# Patient Record
Sex: Female | Born: 1965 | Race: White | Hispanic: No | Marital: Married | Smoking: Never smoker
Health system: Southern US, Community
[De-identification: ages and names within clinical notes are randomized; demographics above are authoritative.]

## PROBLEM LIST (undated history)

## (undated) DIAGNOSIS — E785 Hyperlipidemia, unspecified: Secondary | ICD-10-CM

## (undated) DIAGNOSIS — F419 Anxiety disorder, unspecified: Secondary | ICD-10-CM

## (undated) DIAGNOSIS — Z789 Other specified health status: Secondary | ICD-10-CM

## (undated) DIAGNOSIS — J45909 Unspecified asthma, uncomplicated: Secondary | ICD-10-CM

## (undated) DIAGNOSIS — F32A Depression, unspecified: Secondary | ICD-10-CM

## (undated) HISTORY — DX: Hyperlipidemia, unspecified: E78.5

## (undated) HISTORY — DX: Depression, unspecified: F32.A

## (undated) HISTORY — PX: ABDOMINAL HYSTERECTOMY: SHX81

## (undated) HISTORY — PX: BACK SURGERY: SHX140

## (undated) HISTORY — PX: APPENDECTOMY: SHX54

## (undated) HISTORY — PX: OVARIAN CYST REMOVAL: SHX89

## (undated) HISTORY — DX: Anxiety disorder, unspecified: F41.9

---
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
## 2002-10-30 ENCOUNTER — Other Ambulatory Visit: Admission: RE | Admit: 2002-10-30 | Discharge: 2002-10-30 | Payer: Self-pay | Admitting: Obstetrics and Gynecology

## 2003-11-05 ENCOUNTER — Other Ambulatory Visit: Admission: RE | Admit: 2003-11-05 | Discharge: 2003-11-05 | Payer: Self-pay | Admitting: Obstetrics and Gynecology

## 2007-10-16 ENCOUNTER — Encounter: Admission: RE | Admit: 2007-10-16 | Discharge: 2007-10-16 | Payer: Self-pay | Admitting: Obstetrics and Gynecology

## 2010-10-25 ENCOUNTER — Observation Stay (HOSPITAL_COMMUNITY)
Admission: EM | Admit: 2010-10-25 | Discharge: 2010-10-27 | Disposition: A | Discharge status: Home / Self Care | Payer: BC Managed Care – HMO | Attending: General Surgery | Admitting: General Surgery

## 2010-10-25 ENCOUNTER — Emergency Department (HOSPITAL_COMMUNITY): Payer: BC Managed Care – HMO

## 2010-10-25 DIAGNOSIS — Z01812 Encounter for preprocedural laboratory examination: Secondary | ICD-10-CM | POA: Insufficient documentation

## 2010-10-25 DIAGNOSIS — K3580 Unspecified acute appendicitis: Principal | ICD-10-CM | POA: Insufficient documentation

## 2010-10-25 DIAGNOSIS — F411 Generalized anxiety disorder: Secondary | ICD-10-CM | POA: Insufficient documentation

## 2010-10-25 LAB — URINALYSIS, ROUTINE W REFLEX MICROSCOPIC
Ketones, ur: NEGATIVE mg/dL
Leukocytes, UA: NEGATIVE
Nitrite: NEGATIVE
Specific Gravity, Urine: 1.023 (ref 1.005–1.030)
pH: 5.5 (ref 5.0–8.0)

## 2010-10-25 LAB — CBC
HCT: 36.7 % (ref 36.0–46.0)
Hemoglobin: 12.6 g/dL (ref 12.0–15.0)
MCH: 29.7 pg (ref 26.0–34.0)
MCHC: 34.3 g/dL (ref 30.0–36.0)
RDW: 13.4 % (ref 11.5–15.5)

## 2010-10-25 LAB — COMPREHENSIVE METABOLIC PANEL
CO2: 26 mEq/L (ref 19–32)
Calcium: 8.7 mg/dL (ref 8.4–10.5)
Creatinine, Ser: 0.56 mg/dL (ref 0.4–1.2)
GFR calc non Af Amer: >60 mL/min (ref >60)
Glucose, Bld: 118 mg/dL — ABNORMAL HIGH (ref 70–99)
Total Protein: 7 g/dL (ref 6.0–8.3)

## 2010-10-25 LAB — POCT I-STAT, CHEM 8
BUN: 12 mg/dL (ref 6–23)
Calcium, Ion: 1.02 mmol/L — ABNORMAL LOW (ref 1.12–1.32)
Creatinine, Ser: 0.6 mg/dL (ref 0.4–1.2)
Hemoglobin: 12.6 g/dL (ref 12.0–15.0)
TCO2: 22 mmol/L (ref 0–100)

## 2010-10-25 LAB — URINE MICROSCOPIC-ADD ON

## 2010-10-25 LAB — DIFFERENTIAL
Basophils Absolute: 0 10*3/uL (ref 0.0–0.1)
Basophils Relative: 0 % (ref 0–1)
Eosinophils Relative: 2 % (ref 0–5)
Monocytes Absolute: 1.1 10*3/uL — ABNORMAL HIGH (ref 0.1–1.0)
Monocytes Relative: 9 % (ref 3–12)
Neutro Abs: 9.6 10*3/uL — ABNORMAL HIGH (ref 1.7–7.7)

## 2010-10-25 LAB — POCT PREGNANCY, URINE: Preg Test, Ur: NEGATIVE

## 2010-10-25 MED ORDER — IOHEXOL 300 MG/ML  SOLN
100.0000 mL | Freq: Once | INTRAMUSCULAR | Status: AC | PRN
  Administered 2010-10-25: 100 mL via INTRAVENOUS

## 2010-10-26 ENCOUNTER — Other Ambulatory Visit: Payer: Self-pay | Admitting: General Surgery

## 2010-10-26 LAB — DIFFERENTIAL
Basophils Relative: 0 % (ref 0–1)
Eosinophils Relative: 0 % (ref 0–5)
Monocytes Absolute: 0.7 10*3/uL (ref 0.1–1.0)
Monocytes Relative: 6 % (ref 3–12)
Neutro Abs: 10.3 10*3/uL — ABNORMAL HIGH (ref 1.7–7.7)

## 2010-10-26 LAB — CA 125: CA 125: 48.8 U/mL — ABNORMAL HIGH (ref 0.0–30.2)

## 2010-10-26 LAB — CBC
Hemoglobin: 10.6 g/dL — ABNORMAL LOW (ref 12.0–15.0)
MCH: 29.2 pg (ref 26.0–34.0)
MCHC: 34 g/dL (ref 30.0–36.0)

## 2010-11-02 NOTE — Discharge Summary (Signed)
  NAME:  Renee Chambers, Renee Chambers            ACCOUNT NO.:  [id number]  MEDICAL RECORD NO.:  [id number]           PATIENT TYPE:  I  LOCATION:  5122                         FACILITY:  [facility]  PHYSICIAN:  Ollen Gross. Vernell Morgans, M.D. DATE OF BIRTH:  [birth year]  DATE OF ADMISSION:  10/25/2010 DATE OF DISCHARGE:  10/27/2010                              DISCHARGE SUMMARY   HISTORY OF PRESENT ILLNESS:  Renee Chambers is a patient who complains of abdominal pain in the right lower quadrant, which was associated with nausea, but no vomiting, no fever or chills.  She has a history of previous ovarian cyst and has had  a couple of surgeries for those as well.  She otherwise had some generalized anxiety disorder, which she takes Zoloft for.  The patient was seen after the emergency room workup showed evidence of possible early appendicitis that correlated with an elevated white blood cell count.  Dr. Carolynne Edouard examined the patient and agreed that surgical appendectomy would be recommended.  SUMMARY OF HOSPITAL COURSE:  The patient was admitted on October 25, 2010, was taken to the operating room same day.  She underwent laparoscopic appendectomy.  However, incidentally there was a lot of bloody fluid in the lower pelvis with a lot of scar tissue in the pelvis consistent with prior ovarian surgery.  They apparently were not able to visualize the ovaries, but the findings were consistent with a possible ruptured ovarian cyst.  The patient tolerated the procedure well, was admitted to the floor in stable condition.  Postoperatively, Dr. Carolynne Edouard ordered some tumor markers as it was not clearly evident that appendicitis was the only problem going on.  Her CA19-9 was normal, but her CA-125 was elevated at 48.8. She has otherwise done well, complaining of only mild soreness.  Has tolerated a diet and feels ready for discharge home.  At this time of this discharge, her pathology report is not back yet.  DISCHARGE  DIAGNOSES: 1. Appendicitis, status post laparoscopic appendectomy. 2. History of ovarian cyst, possible acute ruptured cyst this     admission. 3. Generalized anxiety disorder.  DISCHARGE MEDICATIONS:  Include 1. Zyrtec 10 mg daily. 2. Zoloft 100 mg daily. 3. Vicodin 1-2 tablets q.6 h. p.r.n. pain.  I have asked her to contact Dr. Billey Chang office at [facility] to have an appointment made for her in approximately 2 weeks' time.  She is to increase her activity slowly, otherwise, avoid heavy lifting or strenuous activity.  She is to contact our office if any increased pain, nausea, vomiting, or high fevers.     Brayton El, PA-C   ______________________________ Ollen Gross. Vernell Morgans, M.D.    KB/MEDQ  D:  10/27/2010  T:  10/27/2010  Job:  [id number]  Electronically Signed by Brayton El  on 11/01/2010 02:10:20 PM Electronically Signed by Chevis Pretty III M.D. on 11/02/2010 09:30:47 AM

## 2010-11-02 NOTE — Op Note (Signed)
NAME:  Renee Chambers, Renee Chambers            ACCOUNT NO.:  [id number]  MEDICAL RECORD NO.:  [id number]           PATIENT TYPE:  I  LOCATION:  5122                         FACILITY:  [facility]  PHYSICIAN:  Ollen Gross. Vernell Morgans, M.D. DATE OF BIRTH:  July 17, 1965  DATE OF PROCEDURE:  10/26/2010 DATE OF DISCHARGE:                              OPERATIVE REPORT   PREOPERATIVE DIAGNOSIS:  Appendicitis.  POSTOPERATIVE DIAGNOSES:  Appendicitis and possible ruptured ovarian cyst.  PROCEDURES:  Laparoscopic appendectomy.  SURGEON:  Ollen Gross. Vernell Morgans, MD  ANESTHESIA:  General endotracheal.  PROCEDURE IN DETAIL:  After informed consent was obtained, the patient was brought to the operating room, placed in supine position on the operating room table.  After adequate induction of general anesthesia, the patient's abdomen was prepped with ChloraPrep, allowed to dry and draped in usual sterile manner.  The area below the umbilicus was infiltrated with 0.25% Marcaine.  Incision was made with a 15 blade knife.  This incision was carried down through the subcutaneous tissue bluntly with a hemostat and [employer] retractors until linea alba was identified.  Linea alba was incised with a 15-blade knife and each side was grasped with Kocher clamps and elevated anteriorly.  The preperitoneal space was then probed bluntly with the hemostat, the peritoneum was opened, and access was gained to the abdominal cavity.  A 0-Vicryl purse-string stitch was placed in the fascia around the opening.  A Hasson cannula was placed through the opening and anchored in place with the previously placed Vicryl purse-string stitch.  The abdomen was then insufflated with carbon dioxide without difficulty. The patient was placed in Trendelenburg position, rotated with the right side up.  Next, suprapubic area was infiltrated with 0.25% Marcaine.  A small incision was made with a 15 blade knife and 10-mm port was placed bluntly through this  incision into the abdominal cavity under direct vision.  A site between the two was chosen for a 5-mm port.  This area was infiltrated with 0.25% Marcaine.  A small stab incision was made with a 15 blade knife and 5-mm port was placed bluntly through the incision into the abdominal cavity under direct vision.  On inspection of the abdomen, there was a small-to-moderate amount of dark blood present, seemed to be centered down in the pelvis.  The appendix was identified.  It did appear to be little bit inflamed and think near the tip.  At this point, we tried to see the ovaries, but due to her previous two surgeries on her ovary, her tubes and ovaries were fairly stuck down the pelvis.  We were not able to get a very good visualization, but she did seem to have some dark blood consistent with the ruptured ovarian cyst.  Everything else seemed to be sort of secondarily inflamed little bit, there was no pus.  We tried to see the tubes and ovaries, but we could only see part of them, but we could not visualize the entire structures.  At this point, we then took down the mesoappendix sharply with the harmonic scalpel.  Once we were at the base of the appendix where it  joined with the cecum, and this area was cleared of any tissue debris.  We were able to place a laparoscopic blue load GIA 45 mm of 6-row stapler across the base of the appendix where it joined the cecum clamped and fired thereby dividing the base of the appendix between staple lines.  A laparoscopic bag was inserted through the Xylocaine.  The appendix placed in the bag and bag was sealed.  The abdomen was then irrigated with copious amounts of saline, and removed as much as the blood as possible.  The appendix and bag were removed with the Hasson cannula through the infraumbilical port without difficulty.  The fascial defect was closed with the previously placed Vicryl pursestring stitch as well as another figure-of-eight  0-Vicryl stitch.  The rest of the ports were then removed under direct vision and were found to be hemostatic.  The gas was allowed to escape.  The skin incisions were all closed with interrupted 4-0 Monocryl subcuticular stitches.  Dermabond dressings were applied.  The patient tolerated the procedure well.  At the end of the case, all needle, sponge, and instrument counts were correct.  The patient was then awakened and taken to the recovery room in a stable condition.     Ollen Gross. Vernell Morgans, M.D.     PST/MEDQ  D:  10/26/2010  T:  10/26/2010  Job:  [id number]  Electronically Signed by Chevis Pretty III M.D. on 11/02/2010 09:30:43 AM

## 2010-11-02 NOTE — H&P (Signed)
  NAME:  Renee Chambers, Renee Chambers            ACCOUNT NO.:  [id number]  MEDICAL RECORD NO.:  [id number]           PATIENT TYPE:  I  LOCATION:  5122                         FACILITY:  [facility]  PHYSICIAN:  Ollen Gross. Vernell Morgans, M.D. DATE OF BIRTH:  Jan 26, 1966  DATE OF ADMISSION:  10/25/2010 DATE OF DISCHARGE:                             HISTORY & PHYSICAL   Renee Chambers is a 45 year old white female, who presents to the emergency department with right lower quadrant pain that started yesterday evening.  The pain has been associated with nausea, but no vomiting.  She denies any fevers or chills.  No chest pain or shortness of breath.  No diarrhea or dysuria.  The rest of her review of systems are unremarkable.  PAST MEDICAL HISTORY:  Significant for: 1. Ovarian cyst. 2. Anxiety.  PAST SURGICAL HISTORY:  Significant for two surgeries for an ovarian cyst.  MEDICATIONS:  Include Zoloft and Zyrtec.  Allergies are to MORPHINE, which causes itching.  SOCIAL HISTORY:  She denies use of alcohol or tobacco products.  FAMILY HISTORY:  Noncontributory.  PHYSICAL EXAMINATION:  VITAL SIGNS:  Temperature is 98.9, pulse 104, blood pressure 116/67. GENERAL:  She is a well-developed, well-nourished white female in no acute distress. SKIN:  Warm and dry.  No jaundice. EYES:  Extraocular muscles are intact.  Pupils are equal, round, and reactive to light.  Sclerae nonicteric. LUNGS:  Clear bilaterally no use of accessory respiratory muscles. HEART:  Regular rate and rhythm with impulse in the left chest. ABDOMEN:  Soft with some focal right lower quadrant tenderness, but no guarding or peritonitis.  No palpable mass or hepatosplenomegaly. EXTREMITIES:  No cyanosis, clubbing, or edema with good strength in her arms and legs.  PSYCHOLOGIC:  She is alert and oriented x3 with no evidence of anxiety or depression.  On review of her lab work, significant for white count of 12.4.  Her CT scan showed possible  early appendicitis.  ASSESSMENT AND PLAN:  This is a 45 year old white female with what appears to be an acute appendicitis.  Because of the risk of perforation and sepsis, I think she would benefit from having her appendix removed. She would also like to have this done.  I have explained her in detail the risks and benefits of operation to remove the appendix as well as some of technical aspects including the risk of leak, and she understands and wishes to proceed.  We will obtain some routine preoperative lab in preparation of doing this for tonight.     Ollen Gross. Vernell Morgans, M.D.     PST/MEDQ  D:  10/25/2010  T:  10/26/2010  Job:  [id number]  Electronically Signed by Chevis Pretty III M.D. on 11/02/2010 09:30:39 AM

## 2011-02-21 ENCOUNTER — Emergency Department (HOSPITAL_COMMUNITY): Payer: BC Managed Care – HMO

## 2011-02-21 ENCOUNTER — Emergency Department (HOSPITAL_COMMUNITY)
Admission: EM | Admit: 2011-02-21 | Discharge: 2011-02-21 | Disposition: A | Discharge status: Home / Self Care | Payer: BC Managed Care – HMO | Attending: Emergency Medicine | Admitting: Emergency Medicine

## 2011-02-21 DIAGNOSIS — R079 Chest pain, unspecified: Secondary | ICD-10-CM | POA: Insufficient documentation

## 2011-02-21 DIAGNOSIS — R0602 Shortness of breath: Secondary | ICD-10-CM | POA: Insufficient documentation

## 2011-02-21 DIAGNOSIS — R799 Abnormal finding of blood chemistry, unspecified: Secondary | ICD-10-CM | POA: Insufficient documentation

## 2011-02-21 DIAGNOSIS — M549 Dorsalgia, unspecified: Secondary | ICD-10-CM | POA: Insufficient documentation

## 2011-02-21 LAB — URINE MICROSCOPIC-ADD ON

## 2011-02-21 LAB — DIFFERENTIAL
Basophils Relative: 1 % (ref 0–1)
Eosinophils Absolute: 0.7 10*3/uL (ref 0.0–0.7)
Neutrophils Relative %: 62 % (ref 43–77)

## 2011-02-21 LAB — CBC
Platelets: 295 10*3/uL (ref 150–400)
RBC: 4.55 MIL/uL (ref 3.87–5.11)
WBC: 9.5 10*3/uL (ref 4.0–10.5)

## 2011-02-21 LAB — URINALYSIS, ROUTINE W REFLEX MICROSCOPIC
Bilirubin Urine: NEGATIVE
Specific Gravity, Urine: 1.012 (ref 1.005–1.030)
Urobilinogen, UA: 0.2 mg/dL (ref 0.0–1.0)
pH: 5.5 (ref 5.0–8.0)

## 2011-02-21 LAB — POCT I-STAT, CHEM 8
HCT: 40 % (ref 36.0–46.0)
Hemoglobin: 13.6 g/dL (ref 12.0–15.0)
Potassium: 4.5 mEq/L (ref 3.5–5.1)
Sodium: 140 mEq/L (ref 135–145)

## 2011-02-21 LAB — POCT I-STAT TROPONIN I: Troponin i, poc: 0 ng/mL (ref 0.00–0.08)

## 2011-02-21 LAB — D-DIMER, QUANTITATIVE: D-Dimer, Quant: 0.69 ug/mL-FEU — ABNORMAL HIGH (ref 0.00–0.48)

## 2011-02-21 MED ORDER — IOHEXOL 300 MG/ML  SOLN
80.0000 mL | Freq: Once | INTRAMUSCULAR | Status: AC | PRN
  Administered 2011-02-21: 80 mL via INTRAVENOUS

## 2012-12-20 ENCOUNTER — Ambulatory Visit (INDEPENDENT_AMBULATORY_CARE_PROVIDER_SITE_OTHER): Payer: BC Managed Care – HMO | Admitting: Otolaryngology

## 2012-12-20 DIAGNOSIS — H9209 Otalgia, unspecified ear: Secondary | ICD-10-CM

## 2012-12-20 DIAGNOSIS — J342 Deviated nasal septum: Secondary | ICD-10-CM

## 2012-12-20 DIAGNOSIS — J310 Chronic rhinitis: Secondary | ICD-10-CM

## 2013-01-10 ENCOUNTER — Ambulatory Visit (INDEPENDENT_AMBULATORY_CARE_PROVIDER_SITE_OTHER): Payer: BC Managed Care – HMO | Admitting: Otolaryngology

## 2013-08-05 ENCOUNTER — Encounter (HOSPITAL_COMMUNITY): Payer: Self-pay | Admitting: Emergency Medicine

## 2013-08-05 ENCOUNTER — Inpatient Hospital Stay (HOSPITAL_COMMUNITY)
Admission: EM | Admit: 2013-08-05 | Discharge: 2013-08-08 | DRG: 690 | Disposition: A | Discharge status: Home / Self Care | Payer: BC Managed Care – PPO | Attending: Internal Medicine | Admitting: Internal Medicine

## 2013-08-05 ENCOUNTER — Emergency Department (HOSPITAL_COMMUNITY): Payer: BC Managed Care – PPO

## 2013-08-05 DIAGNOSIS — Z23 Encounter for immunization: Secondary | ICD-10-CM

## 2013-08-05 DIAGNOSIS — E876 Hypokalemia: Secondary | ICD-10-CM | POA: Diagnosis present

## 2013-08-05 DIAGNOSIS — F3289 Other specified depressive episodes: Secondary | ICD-10-CM | POA: Diagnosis present

## 2013-08-05 DIAGNOSIS — Z8249 Family history of ischemic heart disease and other diseases of the circulatory system: Secondary | ICD-10-CM

## 2013-08-05 DIAGNOSIS — F329 Major depressive disorder, single episode, unspecified: Secondary | ICD-10-CM | POA: Diagnosis present

## 2013-08-05 DIAGNOSIS — Z79899 Other long term (current) drug therapy: Secondary | ICD-10-CM

## 2013-08-05 DIAGNOSIS — R7881 Bacteremia: Secondary | ICD-10-CM | POA: Diagnosis present

## 2013-08-05 DIAGNOSIS — D649 Anemia, unspecified: Secondary | ICD-10-CM | POA: Diagnosis present

## 2013-08-05 DIAGNOSIS — A498 Other bacterial infections of unspecified site: Secondary | ICD-10-CM | POA: Diagnosis present

## 2013-08-05 DIAGNOSIS — N920 Excessive and frequent menstruation with regular cycle: Secondary | ICD-10-CM | POA: Diagnosis present

## 2013-08-05 DIAGNOSIS — N12 Tubulo-interstitial nephritis, not specified as acute or chronic: Principal | ICD-10-CM | POA: Diagnosis present

## 2013-08-05 DIAGNOSIS — F32A Depression, unspecified: Secondary | ICD-10-CM | POA: Diagnosis present

## 2013-08-05 HISTORY — DX: Other specified health status: Z78.9

## 2013-08-05 LAB — CBC WITH DIFFERENTIAL/PLATELET
Basophils Absolute: 0 10*3/uL (ref 0.0–0.1)
Basophils Relative: 0 % (ref 0–1)
EOS ABS: 0 10*3/uL (ref 0.0–0.7)
Eosinophils Relative: 0 % (ref 0–5)
HCT: 33.4 % — ABNORMAL LOW (ref 36.0–46.0)
HEMOGLOBIN: 10.9 g/dL — AB (ref 12.0–15.0)
Lymphocytes Relative: 5 % — ABNORMAL LOW (ref 12–46)
Lymphs Abs: 0.6 10*3/uL — ABNORMAL LOW (ref 0.7–4.0)
MCH: 26.8 pg (ref 26.0–34.0)
MCHC: 32.6 g/dL (ref 30.0–36.0)
MCV: 82.3 fL (ref 78.0–100.0)
Monocytes Absolute: 0.9 10*3/uL (ref 0.1–1.0)
Monocytes Relative: 7 % (ref 3–12)
NEUTROS PCT: 88 % — AB (ref 43–77)
Neutro Abs: 10.5 10*3/uL — ABNORMAL HIGH (ref 1.7–7.7)
Platelets: 244 10*3/uL (ref 150–400)
RBC: 4.06 MIL/uL (ref 3.87–5.11)
RDW: 13.9 % (ref 11.5–15.5)
WBC: 12 10*3/uL — ABNORMAL HIGH (ref 4.0–10.5)

## 2013-08-05 LAB — COMPREHENSIVE METABOLIC PANEL
ALK PHOS: 73 U/L (ref 39–117)
ALT: 11 U/L (ref 0–35)
AST: 13 U/L (ref 0–37)
Albumin: 3.6 g/dL (ref 3.5–5.2)
BILIRUBIN TOTAL: 0.5 mg/dL (ref 0.3–1.2)
BUN: 10 mg/dL (ref 6–23)
CO2: 24 meq/L (ref 19–32)
Calcium: 8.9 mg/dL (ref 8.4–10.5)
Chloride: 100 mEq/L (ref 96–112)
Creatinine, Ser: 0.53 mg/dL (ref 0.50–1.10)
GLUCOSE: 121 mg/dL — AB (ref 70–99)
POTASSIUM: 3.4 meq/L — AB (ref 3.7–5.3)
Sodium: 137 mEq/L (ref 137–147)
TOTAL PROTEIN: 7.2 g/dL (ref 6.0–8.3)

## 2013-08-05 LAB — URINE MICROSCOPIC-ADD ON

## 2013-08-05 LAB — POCT PREGNANCY, URINE: Preg Test, Ur: NEGATIVE

## 2013-08-05 LAB — URINALYSIS, ROUTINE W REFLEX MICROSCOPIC
BILIRUBIN URINE: NEGATIVE
GLUCOSE, UA: NEGATIVE mg/dL
KETONES UR: NEGATIVE mg/dL
Nitrite: NEGATIVE
PH: 7.5 (ref 5.0–8.0)
Protein, ur: NEGATIVE mg/dL
Specific Gravity, Urine: 1.015 (ref 1.005–1.030)
Urobilinogen, UA: 0.2 mg/dL (ref 0.0–1.0)

## 2013-08-05 MED ORDER — CEFTRIAXONE SODIUM 2 G IJ SOLR
2.0000 g | Freq: Once | INTRAMUSCULAR | Status: DC
  Filled 2013-08-05 (×2): qty 2

## 2013-08-05 MED ORDER — IOHEXOL 300 MG/ML  SOLN: 100.0000 mL | Freq: Once | INTRAMUSCULAR | Status: AC | PRN

## 2013-08-05 MED ORDER — SODIUM CHLORIDE 0.9 % IV SOLN
1000.0000 mL | INTRAVENOUS | Status: DC
  Administered 2013-08-05: 1000 mL via INTRAVENOUS

## 2013-08-05 MED ORDER — DIPHENHYDRAMINE HCL 50 MG/ML IJ SOLN
25.0000 mg | Freq: Once | INTRAMUSCULAR | Status: AC
  Administered 2013-08-05: 25 mg via INTRAVENOUS
  Filled 2013-08-05: qty 1

## 2013-08-05 MED ORDER — MORPHINE SULFATE 4 MG/ML IJ SOLN
4.0000 mg | Freq: Once | INTRAMUSCULAR | Status: DC
  Filled 2013-08-05: qty 1

## 2013-08-05 MED ORDER — HYDROMORPHONE HCL PF 1 MG/ML IJ SOLN
1.0000 mg | Freq: Once | INTRAMUSCULAR | Status: AC
  Administered 2013-08-05: 1 mg via INTRAVENOUS
  Filled 2013-08-05: qty 1

## 2013-08-05 MED ORDER — SODIUM CHLORIDE 0.9 % IV SOLN: 1000.0000 mL | Freq: Once | INTRAVENOUS | Status: DC

## 2013-08-05 MED ORDER — SODIUM CHLORIDE 0.9 % IV BOLUS (SEPSIS)
1000.0000 mL | Freq: Once | INTRAVENOUS | Status: AC
  Administered 2013-08-05: 1000 mL via INTRAVENOUS

## 2013-08-05 MED ORDER — IOHEXOL 300 MG/ML  SOLN
50.0000 mL | Freq: Once | INTRAMUSCULAR | Status: AC | PRN
  Administered 2013-08-05: 50 mL via ORAL

## 2013-08-05 MED ORDER — ACETAMINOPHEN 500 MG PO TABS
1000.0000 mg | ORAL_TABLET | Freq: Once | ORAL | Status: AC
  Administered 2013-08-05: 1000 mg via ORAL
  Filled 2013-08-05: qty 2

## 2013-08-05 NOTE — ED Notes (Signed)
Pt having bilateral lower back pain that came on suddenly tonight. Hypertensive. Recent UTI sx. Alert and oriented.

## 2013-08-05 NOTE — ED Notes (Signed)
Patient does not have a hx of lower back pain

## 2013-08-05 NOTE — ED Provider Notes (Signed)
CSN: [id number]     Arrival date & time 08/05/13  2011 History   First MD Initiated Contact with Patient 08/05/13 2145     Chief Complaint  Patient presents with  . Back Pain  . Tremors   (Consider location/radiation/quality/duration/timing/severity/associated sxs/prior Treatment) HPI Comments: Patient is a 48 year old female PMHx significant for endometriosis presenting to the emergency department for acute onset low back burning pressure with associated suprapubic pressure that began this evening. Patient states she has had urinary tract symptoms last 2 days, endorses dysuria, frequency, urgency. Patient also endorses chills just prior to arrival. She took 400 mg of ibuprofen prior to arrival to the emergency department which did help with her chills. Patient denies any history of urinary tract infections. She states her abdominal surgical history includes appendectomy and ovarian cyst removal.  Patient is a 48 y.o. female presenting with back pain.  Back Pain Associated symptoms: abdominal pain and dysuria   Associated symptoms: no chest pain and no fever     History reviewed. No pertinent past medical history. Past Surgical History  Procedure Laterality Date  . Appendectomy    . Ovarian cyst removal     No family history on file. History  Substance Use Topics  . Smoking status: Never Smoker   . Smokeless tobacco: Not on file  . Alcohol Use: No   OB History   Grav Para Term Preterm Abortions TAB SAB Ect Mult Living                 Review of Systems  Constitutional: Positive for chills. Negative for fever.  Respiratory: Negative for shortness of breath.   Cardiovascular: Negative for chest pain.  Gastrointestinal: Positive for nausea, abdominal pain and constipation.  Genitourinary: Positive for dysuria, urgency and frequency.  Musculoskeletal: Positive for back pain.  All other systems reviewed and are negative.    Allergies  Morphine and related  Home Medications     Current Outpatient Rx  Name  Route  Sig  Dispense  Refill  . cyclobenzaprine (FLEXERIL) 10 MG tablet   Oral   Take 10 mg by mouth at bedtime.         Marland Kitchen ibuprofen (ADVIL,MOTRIN) 200 MG tablet   Oral   Take 400 mg by mouth every 6 (six) hours as needed for moderate pain.         . montelukast (SINGULAIR) 10 MG tablet   Oral   Take 10 mg by mouth at bedtime.         . sertraline (ZOLOFT) 100 MG tablet   Oral   Take 100 mg by mouth at bedtime.          BP 106/59  Pulse 105  Temp(Src) 100 F (37.8 C) (Oral)  Resp 20  SpO2 95%  LMP 07/31/2013 Physical Exam  Constitutional: She is oriented to person, place, and time. She appears well-developed and well-nourished.  HENT:  Head: Normocephalic and atraumatic.  Right Ear: External ear normal.  Left Ear: External ear normal.  Nose: Nose normal.  Eyes: Conjunctivae are normal.  Neck: Neck supple.  Cardiovascular: Normal rate, regular rhythm and normal heart sounds.   Pulmonary/Chest: Effort normal and breath sounds normal.  Abdominal: Soft. Bowel sounds are normal. She exhibits no distension. There is tenderness in the suprapubic area. There is no rigidity, no rebound and no guarding.  Musculoskeletal: Normal range of motion.       Lumbar back: She exhibits normal range of motion, no bony tenderness, no  swelling, no edema, no deformity, no laceration, no pain, no spasm and normal pulse.       Back:  Neurological: She is alert and oriented to person, place, and time. She has normal strength. No cranial nerve deficit or sensory deficit. Gait normal. GCS eye subscore is 4. GCS verbal subscore is 5. GCS motor subscore is 6.  No pronator drift. Bilateral heel-knee-shin intact.   Skin: Skin is warm and dry. She is not diaphoretic.    ED Course  Procedures (including critical care time) Medications  morphine 4 MG/ML injection 4 mg (4 mg Intravenous Not Given 08/06/13 0129)  0.9 %  sodium chloride infusion (0 mLs Intravenous  Stopped 08/05/13 2248)    Followed by  0.9 %  sodium chloride infusion (1,000 mLs Intravenous New Bag/Given 08/05/13 2314)  cefTRIAXone (ROCEPHIN) 2 g in dextrose 5 % 50 mL IVPB (0 g Intravenous Stopped 08/05/13 2321)  iohexol (OMNIPAQUE) 300 MG/ML solution 100 mL (not administered)  cefTRIAXone (ROCEPHIN) 2 g in dextrose 5 % 50 mL IVPB (not administered)  0.9 %  sodium chloride infusion (not administered)  ondansetron (ZOFRAN) injection 4 mg (not administered)  sodium chloride 0.9 % bolus 1,000 mL (1,000 mLs Intravenous New Bag/Given 08/05/13 2210)  acetaminophen (TYLENOL) tablet 1,000 mg (1,000 mg Oral Given 08/05/13 2313)  diphenhydrAMINE (BENADRYL) injection 25 mg (25 mg Intravenous Given 08/05/13 2313)  iohexol (OMNIPAQUE) 300 MG/ML solution 50 mL (50 mLs Oral Contrast Given 08/05/13 2241)  HYDROmorphone (DILAUDID) injection 1 mg (1 mg Intravenous Given 08/05/13 2321)  iohexol (OMNIPAQUE) 300 MG/ML solution 100 mL (100 mLs Intravenous Contrast Given 08/06/13 0015)  prochlorperazine (COMPAZINE) injection 10 mg (10 mg Intravenous Given 08/06/13 0133)    Labs Review Labs Reviewed  URINALYSIS, ROUTINE W REFLEX MICROSCOPIC - Abnormal; Notable for the following:    APPearance CLOUDY (*)    Hgb urine dipstick MODERATE (*)    Leukocytes, UA MODERATE (*)    All other components within normal limits  URINE MICROSCOPIC-ADD ON - Abnormal; Notable for the following:    Squamous Epithelial / LPF FEW (*)    Bacteria, UA FEW (*)    All other components within normal limits  CBC WITH DIFFERENTIAL - Abnormal; Notable for the following:    WBC 12.0 (*)    Hemoglobin 10.9 (*)    HCT 33.4 (*)    Neutrophils Relative % 88 (*)    Neutro Abs 10.5 (*)    Lymphocytes Relative 5 (*)    Lymphs Abs 0.6 (*)    All other components within normal limits  COMPREHENSIVE METABOLIC PANEL - Abnormal; Notable for the following:    Potassium 3.4 (*)    Glucose, Bld 121 (*)    All other components within normal limits    CG4 I-STAT (LACTIC ACID) - Abnormal; Notable for the following:    Lactic Acid, Venous 0.45 (*)    All other components within normal limits  URINE CULTURE  CULTURE, BLOOD (ROUTINE X 2)  CULTURE, BLOOD (ROUTINE X 2)  POCT PREGNANCY, URINE   Imaging Review Ct Abdomen Pelvis W Contrast  08/06/2013   CLINICAL DATA:  Back pain, hypertension, recent urinary tract infection, prior appendectomy  EXAM: CT ABDOMEN AND PELVIS WITH CONTRAST  TECHNIQUE: Multidetector CT imaging of the abdomen and pelvis was performed using the standard protocol following bolus administration of intravenous contrast.  CONTRAST:  OMNIPAQUE IOHEXOL 300 MG/ML SOLN, 50mL OMNIPAQUE IOHEXOL 300 MG/ML SOLN  COMPARISON:  None.  FINDINGS: Minor basilar  atelectasis. No lower lobe pneumonia. Pectus excavatum deformity of the chest. Normal heart size. No pericardial or pleural effusion.  Abdomen: Liver, gallbladder, biliary system, pancreas, adrenal glands are within normal limits for age and demonstrate no acute process.  Spleen demonstrates a peripherally calcified cyst posteriorly measuring 4.1 cm, image 13. Additional subcentimeter 10 mm cyst in the spleen anteriorly. Kidneys demonstrate no renal obstruction or hydronephrosis. Left kidney has a punctate nonobstructing calculus in the lower pole. Right kidney demonstrates a lower pole cortical cyst measuring 10 mm. Left ureter demonstrates Minor strandy edema and ureteral mucosal enhancement diffusely. No obstructing stone. Left ureter appearance is suspicious for ascending urinary tract infection.  Negative for bowel obstruction, dilatation, or ileus.  No free air.  No abdominal free fluid, fluid collection, hemorrhage, abscess, or adenopathy.  Negative for aneurysm.  Pelvis: Bilateral adnexal complex cyst noted of the ovaries. Dominant cyst on the right ovary measures 4.6 x 4.4 cm. Larger cyst on the left ovary measures 11 x 5 cm. These are suspicious for polycystic ovaries.  Additional considerations would include a hydrosalpinx. Consider follow-up pelvic ultrasound. Uterus is normal in size. Urinary bladder unremarkable. No pelvic free fluid, hemorrhage, hematoma, adenopathy, inguinal abnormality, or hernia.  No acute or abnormal osseous finding.  IMPRESSION: Left ureteral strandy edema and enhancement concerning for ascending left urinary tract infection or pyelonephritis.  No associated obstructing ureteral calculus  Incidental punctate nonobstructing left intrarenal calculus  Calcified minimally complex splenic cyst measuring 4.1 cm  Pectus excavatum deformity of the lower chest  Bilateral cystic adnexal ovarian abnormalities by CT could represent polycystic ovaries and or hydrosalpinx. Recommend follow-up nonemergent pelvic ultrasound.  No pelvic free fluid   Electronically Signed   By: Ruel Favorsrevor  Shick M.D.   On: 08/06/2013 01:10   Dg Chest Port 1 View  08/05/2013   CLINICAL DATA:  Back pain, tremors  EXAM: PORTABLE CHEST - 1 VIEW  COMPARISON:  None.  FINDINGS: The heart size and mediastinal contours are within normal limits. Both lungs are clear. The visualized skeletal structures are unremarkable.  IMPRESSION: No active disease.   Electronically Signed   By: Ruel Favorsrevor  Shick M.D.   On: 08/05/2013 23:21    EKG Interpretation   None       MDM   1. Pyelonephritis     Filed Vitals:   08/06/13 0130  BP: 106/59  Pulse: 105  Temp:   Resp:    Patient low grade fever, low back pain with urinary symptoms and nausea. Patient tachycardic. Suprapubic tenderness on abdominal examination, no guarding, rebound or rigidity. No peritoneal signs. Low back mildly tender, no posterior midline tenderness. No neuro focal deficits on examination. Urine with moderate leukocytosis and bacteria. Mild leukocytosis on CBC. Labs reviewed. CT scan consistent with pyelonephritis. Given fever, tachycardia, leukocytosis with source of infection IV Rocephin started. Patient will be admitted for  further management and evaluation. Patient d/w with Dr. Elesa MassedWard, agrees with plan.      Jeannetta EllisJennifer L Jordell Outten, PA-C 08/06/13 [id number]

## 2013-08-06 ENCOUNTER — Encounter (HOSPITAL_COMMUNITY): Payer: Self-pay | Admitting: Internal Medicine

## 2013-08-06 DIAGNOSIS — D649 Anemia, unspecified: Secondary | ICD-10-CM | POA: Diagnosis present

## 2013-08-06 DIAGNOSIS — F32A Depression, unspecified: Secondary | ICD-10-CM | POA: Diagnosis present

## 2013-08-06 DIAGNOSIS — F329 Major depressive disorder, single episode, unspecified: Secondary | ICD-10-CM | POA: Diagnosis present

## 2013-08-06 DIAGNOSIS — N12 Tubulo-interstitial nephritis, not specified as acute or chronic: Principal | ICD-10-CM

## 2013-08-06 LAB — CBC WITH DIFFERENTIAL/PLATELET
BASOS ABS: 0 10*3/uL (ref 0.0–0.1)
BASOS PCT: 0 % (ref 0–1)
Eosinophils Absolute: 0.1 10*3/uL (ref 0.0–0.7)
Eosinophils Relative: 1 % (ref 0–5)
HCT: 31.3 % — ABNORMAL LOW (ref 36.0–46.0)
Hemoglobin: 10.1 g/dL — ABNORMAL LOW (ref 12.0–15.0)
Lymphocytes Relative: 10 % — ABNORMAL LOW (ref 12–46)
Lymphs Abs: 1.1 10*3/uL (ref 0.7–4.0)
MCH: 26.9 pg (ref 26.0–34.0)
MCHC: 32.3 g/dL (ref 30.0–36.0)
MCV: 83.2 fL (ref 78.0–100.0)
Monocytes Absolute: 1.3 10*3/uL — ABNORMAL HIGH (ref 0.1–1.0)
Monocytes Relative: 11 % (ref 3–12)
NEUTROS ABS: 8.7 10*3/uL — AB (ref 1.7–7.7)
NEUTROS PCT: 78 % — AB (ref 43–77)
Platelets: 217 10*3/uL (ref 150–400)
RBC: 3.76 MIL/uL — ABNORMAL LOW (ref 3.87–5.11)
RDW: 14.3 % (ref 11.5–15.5)
WBC: 11.2 10*3/uL — ABNORMAL HIGH (ref 4.0–10.5)

## 2013-08-06 LAB — COMPREHENSIVE METABOLIC PANEL
ALT: 9 U/L (ref 0–35)
AST: 10 U/L (ref 0–37)
Albumin: 2.8 g/dL — ABNORMAL LOW (ref 3.5–5.2)
Alkaline Phosphatase: 61 U/L (ref 39–117)
BILIRUBIN TOTAL: 0.3 mg/dL (ref 0.3–1.2)
BUN: 6 mg/dL (ref 6–23)
CALCIUM: 7.8 mg/dL — AB (ref 8.4–10.5)
CO2: 25 meq/L (ref 19–32)
CREATININE: 0.53 mg/dL (ref 0.50–1.10)
Chloride: 106 mEq/L (ref 96–112)
GFR calc Af Amer: >90 mL/min (ref >90)
Glucose, Bld: 129 mg/dL — ABNORMAL HIGH (ref 70–99)
Potassium: 4 mEq/L (ref 3.7–5.3)
Sodium: 141 mEq/L (ref 137–147)
Total Protein: 6.1 g/dL (ref 6.0–8.3)

## 2013-08-06 LAB — IRON AND TIBC
IRON: 10 ug/dL — AB (ref 42–135)
Saturation Ratios: 3 % — ABNORMAL LOW (ref 20–55)
TIBC: 306 ug/dL (ref 250–470)
UIBC: 296 ug/dL (ref 125–400)

## 2013-08-06 LAB — PREGNANCY, URINE: PREG TEST UR: NEGATIVE

## 2013-08-06 LAB — RETICULOCYTES
RBC.: 3.76 MIL/uL — ABNORMAL LOW (ref 3.87–5.11)
RETIC COUNT ABSOLUTE: 48.9 10*3/uL (ref 19.0–186.0)
Retic Ct Pct: 1.3 % (ref 0.4–3.1)

## 2013-08-06 LAB — FOLATE: FOLATE: 11.9 ng/mL

## 2013-08-06 LAB — FERRITIN: FERRITIN: 32 ng/mL (ref 10–291)

## 2013-08-06 LAB — VITAMIN B12: Vitamin B-12: 549 pg/mL (ref 211–911)

## 2013-08-06 LAB — CG4 I-STAT (LACTIC ACID): Lactic Acid, Venous: 0.45 mmol/L — ABNORMAL LOW (ref 0.5–2.2)

## 2013-08-06 MED ORDER — ONDANSETRON HCL 4 MG/2ML IJ SOLN: 4.0000 mg | Freq: Three times a day (TID) | INTRAMUSCULAR | Status: DC | PRN

## 2013-08-06 MED ORDER — SODIUM CHLORIDE 0.9 % IV SOLN: INTRAVENOUS | Status: DC

## 2013-08-06 MED ORDER — CYCLOBENZAPRINE HCL 10 MG PO TABS
10.0000 mg | ORAL_TABLET | Freq: Every day | ORAL | Status: DC
  Administered 2013-08-06 – 2013-08-07 (×2): 10 mg via ORAL
  Filled 2013-08-06 (×3): qty 1

## 2013-08-06 MED ORDER — ONDANSETRON HCL 4 MG PO TABS: 4.0000 mg | ORAL_TABLET | Freq: Four times a day (QID) | ORAL | Status: DC | PRN

## 2013-08-06 MED ORDER — IOHEXOL 300 MG/ML  SOLN
100.0000 mL | Freq: Once | INTRAMUSCULAR | Status: AC | PRN
  Administered 2013-08-06: 100 mL via INTRAVENOUS

## 2013-08-06 MED ORDER — ENOXAPARIN SODIUM 40 MG/0.4ML ~~LOC~~ SOLN
40.0000 mg | SUBCUTANEOUS | Status: DC
  Filled 2013-08-06 (×3): qty 0.4

## 2013-08-06 MED ORDER — SERTRALINE HCL 100 MG PO TABS
100.0000 mg | ORAL_TABLET | Freq: Every day | ORAL | Status: DC
  Administered 2013-08-06 – 2013-08-07 (×2): 100 mg via ORAL
  Filled 2013-08-06 (×3): qty 1

## 2013-08-06 MED ORDER — HYDROMORPHONE HCL PF 1 MG/ML IJ SOLN
0.5000 mg | INTRAMUSCULAR | Status: DC | PRN
  Administered 2013-08-06 – 2013-08-07 (×4): 0.5 mg via INTRAVENOUS
  Filled 2013-08-06 (×4): qty 1

## 2013-08-06 MED ORDER — ACETAMINOPHEN 325 MG PO TABS
650.0000 mg | ORAL_TABLET | Freq: Four times a day (QID) | ORAL | Status: DC | PRN
  Administered 2013-08-07: 650 mg via ORAL
  Filled 2013-08-06: qty 2

## 2013-08-06 MED ORDER — DEXTROSE 5 % IV SOLN: 2.0000 g | Freq: Every day | INTRAVENOUS | Status: DC

## 2013-08-06 MED ORDER — PROCHLORPERAZINE EDISYLATE 5 MG/ML IJ SOLN
10.0000 mg | Freq: Once | INTRAMUSCULAR | Status: AC
  Administered 2013-08-06: 10 mg via INTRAVENOUS
  Filled 2013-08-06: qty 2

## 2013-08-06 MED ORDER — ONDANSETRON HCL 4 MG/2ML IJ SOLN: 4.0000 mg | Freq: Four times a day (QID) | INTRAMUSCULAR | Status: DC | PRN

## 2013-08-06 MED ORDER — SODIUM CHLORIDE 0.9 % IV SOLN
INTRAVENOUS | Status: AC
  Administered 2013-08-06: 06:00:00 via INTRAVENOUS

## 2013-08-06 MED ORDER — MONTELUKAST SODIUM 10 MG PO TABS
10.0000 mg | ORAL_TABLET | Freq: Every day | ORAL | Status: DC
  Administered 2013-08-06 – 2013-08-07 (×2): 10 mg via ORAL
  Filled 2013-08-06 (×3): qty 1

## 2013-08-06 MED ORDER — DEXTROSE 5 % IV SOLN
2.0000 g | INTRAVENOUS | Status: DC
  Administered 2013-08-06 – 2013-08-08 (×3): 2 g via INTRAVENOUS
  Filled 2013-08-06 (×3): qty 2

## 2013-08-06 MED ORDER — ACETAMINOPHEN 650 MG RE SUPP: 650.0000 mg | Freq: Four times a day (QID) | RECTAL | Status: DC | PRN

## 2013-08-06 MED ORDER — INFLUENZA VAC SPLIT QUAD 0.5 ML IM SUSP
0.5000 mL | INTRAMUSCULAR | Status: AC
  Administered 2013-08-07: 0.5 mL via INTRAMUSCULAR
  Filled 2013-08-06 (×2): qty 0.5

## 2013-08-06 NOTE — Progress Notes (Signed)
[facility] PROGRESS NOTE  Renee Chambers:[id number] DOB: Apr 18, 1966 DOA: 08/05/2013 PCP: Gaye Alken, MD  Assessment/Plan:  Left pyelonephritis  - patient has been placed on ceftriaxone IV. Follow urine cultures. I have placed patient on pain with medications and gentle hydration. Advance diet as tolerated. If patient's pain improves and is able to tolerate diet that patient may probably discharge home later in the day with oral antibiotics.  Anemia  - patient states that she has heavy menstrual cycle. Check anemia panel. If there is no significant fall in hemoglobin then further workup as outpatient.    Code Status: Full Family Communication:  Disposition Plan: Admitted 0600, 1/27   Consultants:    Procedures: CT abdomen and pelvis with contrast 08/06/2013 Left ureteral strandy edema and enhancement concerning for ascending  left urinary tract infection or pyelonephritis.  No associated obstructing ureteral calculus  Incidental punctate nonobstructing left intrarenal calculus  Calcified minimally complex splenic cyst measuring 4.1 cm  Pectus excavatum deformity of the lower chest  Bilateral cystic adnexal ovarian abnormalities by CT could represent  polycystic ovaries and or hydrosalpinx. Recommend follow-up  nonemergent pelvic ultrasound.  No pelvic free fluid     Antibiotics: Ceftriaxone 1/27    HPI/Subjective: Renee Chambers is a 48 y.o. WF PMHx depression, anemia, female presented to the ER because of surrounds of left flank pain with chills-like symptoms last night. Patient did not have any associated nausea vomiting or diarrhea. UA shows features concerning for UTI. CT abdomen and pelvis was done as patient has significant left flank pain which shows features concerning for ascending infection of the left genitourinary tract. Patient has been started on IV antibiotics after blood cultures and urine cultures were obtained. Patient  otherwise denies any chest pain or shortness of breath. 1/27 admitted 3 hours previous no additional complaints no change in plan   Objective: Filed Vitals:   08/06/13 0130 08/06/13 0200 08/06/13 0321 08/06/13 0453  BP: 106/59 103/58 101/60 90/51  Pulse: 105 99 93 89  Temp:   97.9 F (36.6 C) 97.9 F (36.6 C)  TempSrc:   Oral Oral  Resp:   18 18  Height:   5\' 4"  (1.626 m)   Weight:   70.4 kg (155 lb 3.3 oz)   SpO2: 95% 95% 96% 97%   No intake or output data in the 24 hours ending 08/06/13 0848 Filed Weights   08/06/13 0321  Weight: 70.4 kg (155 lb 3.3 oz)    Exam: General: Well-developed and nourished.  Eyes: Anicteric no pallor.  ENT: No discharge from ears eyes nose mouth.  Neck: No mass felt.  Cardiovascular: S1-S2 heard.  Respiratory: No rhonchi or crepitations.  Abdomen: Soft present the patient's abdomen is nontender as patient has received pain relief medications. No guarding or rigidity.  Skin: No rash.  Musculoskeletal: No edema.  Data Reviewed: Basic Metabolic Panel:  Recent Labs Lab 08/05/13 2205 08/06/13 0625  NA 137 141  K 3.4* 4.0  CL 100 106  CO2 24 25  GLUCOSE 121* 129*  BUN 10 6  CREATININE 0.53 0.53  CALCIUM 8.9 7.8*   Liver Function Tests:  Recent Labs Lab 08/05/13 2205 08/06/13 0625  AST 13 10  ALT 11 9  ALKPHOS 73 61  BILITOT 0.5 0.3  PROT 7.2 6.1  ALBUMIN 3.6 2.8*   No results found for this basename: LIPASE, AMYLASE,  in the last 168 hours No results found for this basename: AMMONIA,  in the last 168 hours CBC:  Recent Labs Lab 08/05/13 2205 08/06/13 0625  WBC 12.0* 11.2*  NEUTROABS 10.5* 8.7*  HGB 10.9* 10.1*  HCT 33.4* 31.3*  MCV 82.3 83.2  PLT 244 217   Cardiac Enzymes: No results found for this basename: CKTOTAL, CKMB, CKMBINDEX, TROPONINI,  in the last 168 hours BNP (last 3 results) No results found for this basename: PROBNP,  in the last 8760 hours CBG: No results found for this basename: GLUCAP,  in the  last 168 hours  No results found for this or any previous visit (from the past 240 hour(s)).   Studies: Ct Abdomen Pelvis W Contrast  08/06/2013   CLINICAL DATA:  Back pain, hypertension, recent urinary tract infection, prior appendectomy  EXAM: CT ABDOMEN AND PELVIS WITH CONTRAST  TECHNIQUE: Multidetector CT imaging of the abdomen and pelvis was performed using the standard protocol following bolus administration of intravenous contrast.  CONTRAST:  100mL OMNIPAQUE IOHEXOL 300 MG/ML SOLN, 50mL OMNIPAQUE IOHEXOL 300 MG/ML SOLN  COMPARISON:  None.  FINDINGS: Minor basilar atelectasis. No lower lobe pneumonia. Pectus excavatum deformity of the chest. Normal heart size. No pericardial or pleural effusion.  Abdomen: Liver, gallbladder, biliary system, pancreas, adrenal glands are within normal limits for age and demonstrate no acute process.  Spleen demonstrates a peripherally calcified cyst posteriorly measuring 4.1 cm, image 13. Additional subcentimeter 10 mm cyst in the spleen anteriorly. Kidneys demonstrate no renal obstruction or hydronephrosis. Left kidney has a punctate nonobstructing calculus in the lower pole. Right kidney demonstrates a lower pole cortical cyst measuring 10 mm. Left ureter demonstrates Minor strandy edema and ureteral mucosal enhancement diffusely. No obstructing stone. Left ureter appearance is suspicious for ascending urinary tract infection.  Negative for bowel obstruction, dilatation, or ileus.  No free air.  No abdominal free fluid, fluid collection, hemorrhage, abscess, or adenopathy.  Negative for aneurysm.  Pelvis: Bilateral adnexal complex cyst noted of the ovaries. Dominant cyst on the right ovary measures 4.6 x 4.4 cm. Larger cyst on the left ovary measures 11 x 5 cm. These are suspicious for polycystic ovaries. Additional considerations would include a hydrosalpinx. Consider follow-up pelvic ultrasound. Uterus is normal in size. Urinary bladder unremarkable. No pelvic free  fluid, hemorrhage, hematoma, adenopathy, inguinal abnormality, or hernia.  No acute or abnormal osseous finding.  IMPRESSION: Left ureteral strandy edema and enhancement concerning for ascending left urinary tract infection or pyelonephritis.  No associated obstructing ureteral calculus  Incidental punctate nonobstructing left intrarenal calculus  Calcified minimally complex splenic cyst measuring 4.1 cm  Pectus excavatum deformity of the lower chest  Bilateral cystic adnexal ovarian abnormalities by CT could represent polycystic ovaries and or hydrosalpinx. Recommend follow-up nonemergent pelvic ultrasound.  No pelvic free fluid   Electronically Signed   By: Ruel Favorsrevor  Shick M.D.   On: 08/06/2013 01:10   Dg Chest Port 1 View  08/05/2013   CLINICAL DATA:  Back pain, tremors  EXAM: PORTABLE CHEST - 1 VIEW  COMPARISON:  None.  FINDINGS: The heart size and mediastinal contours are within normal limits. Both lungs are clear. The visualized skeletal structures are unremarkable.  IMPRESSION: No active disease.   Electronically Signed   By: Ruel Favorsrevor  Shick M.D.   On: 08/05/2013 23:21    Scheduled Meds: . cefTRIAXone (ROCEPHIN)  IV  2 g Intravenous Q24H  . cyclobenzaprine  10 mg Oral QHS  . enoxaparin (LOVENOX) injection  40 mg Subcutaneous Q24H  . montelukast  10 mg Oral QHS  . sertraline  100 mg Oral QHS  Continuous Infusions: . sodium chloride 100 mL/hr at 08/06/13 0559    Active Problems:   Pyelonephritis   Anemia   Depression    Time spent: 40 min    WOODS, CURTIS, J  [facility] Pager [phone]. If 7PM-7AM, please contact night-coverage at [email or web address], password [facility] 08/06/2013, 8:48 AM  LOS: 1 day

## 2013-08-06 NOTE — H&P (Signed)
[facility] History and Physical  Nailah Luepke ZOX:[id number] DOB: 03-Oct-1965 DOA: 08/05/2013  Referring physician: ER physician. PCP: Gaye Alken, MD   Chief Complaint: Left flank pain.  HPI: Renee Chambers is a 48 y.o. female presented to the ER because of surrounds of left flank pain with chills-like symptoms last night. Patient did not have any associated nausea vomiting or diarrhea. UA shows features concerning for UTI. CT abdomen and pelvis was done as patient has significant left flank pain which shows features concerning for ascending infection of the left genitourinary tract. Patient has been started on IV antibiotics after blood cultures and urine cultures were obtained. Patient otherwise denies any chest pain or shortness of breath.   Review of Systems: As presented in the history of presenting illness, rest negative.  Past Medical History  Diagnosis Date  . Medical history non-contributory    Past Surgical History  Procedure Laterality Date  . Appendectomy    . Ovarian cyst removal     Social History:  reports that she has never smoked. She does not have any smokeless tobacco history on file. She reports that she does not drink alcohol or use illicit drugs. Where does patient live home. Can patient participate in ADLs? Yes.  Allergies  Allergen Reactions  . Morphine And Related Itching    Family History:  Family History  Problem Relation Age of Onset  . Hypertension Father   . CAD Father       Prior to Admission medications   Medication Sig Start Date End Date Taking? Authorizing Provider  cyclobenzaprine (FLEXERIL) 10 MG tablet Take 10 mg by mouth at bedtime.   Yes Historical Provider, MD  ibuprofen (ADVIL,MOTRIN) 200 MG tablet Take 400 mg by mouth every 6 (six) hours as needed for moderate pain.   Yes Historical Provider, MD  montelukast (SINGULAIR) 10 MG tablet Take 10 mg by mouth at bedtime.   Yes Historical Provider, MD  sertraline  (ZOLOFT) 100 MG tablet Take 100 mg by mouth at bedtime.   Yes Historical Provider, MD    Physical Exam: Filed Vitals:   08/06/13 0130 08/06/13 0200 08/06/13 0321 08/06/13 0453  BP: 106/59 103/58 101/60 90/51  Pulse: 105 99 93 89  Temp:   97.9 F (36.6 C) 97.9 F (36.6 C)  TempSrc:   Oral Oral  Resp:   18 18  Height:   5\' 4"  (1.626 m)   Weight:   70.4 kg (155 lb 3.3 oz)   SpO2: 95% 95% 96% 97%     General:  Well-developed and nourished.  Eyes: Anicteric no pallor.  ENT: No discharge from ears eyes nose mouth.  Neck: No mass felt.  Cardiovascular: S1-S2 heard.  Respiratory: No rhonchi or crepitations.  Abdomen: Soft present the patient's abdomen is nontender as patient has received pain relief medications. No guarding or rigidity.  Skin: No rash.  Musculoskeletal: No edema.  Psychiatric: Appears normal.  Neurologic: Alert awake oriented to time place and person. Moves all extremities.  Labs on Admission:  Basic Metabolic Panel:  Recent Labs Lab 08/05/13 2205  NA 137  K 3.4*  CL 100  CO2 24  GLUCOSE 121*  BUN 10  CREATININE 0.53  CALCIUM 8.9   Liver Function Tests:  Recent Labs Lab 08/05/13 2205  AST 13  ALT 11  ALKPHOS 73  BILITOT 0.5  PROT 7.2  ALBUMIN 3.6   No results found for this basename: LIPASE, AMYLASE,  in the last 168 hours No results found  for this basename: AMMONIA,  in the last 168 hours CBC:  Recent Labs Lab 08/05/13 2205  WBC 12.0*  NEUTROABS 10.5*  HGB 10.9*  HCT 33.4*  MCV 82.3  PLT 244   Cardiac Enzymes: No results found for this basename: CKTOTAL, CKMB, CKMBINDEX, TROPONINI,  in the last 168 hours  BNP (last 3 results) No results found for this basename: PROBNP,  in the last 8760 hours CBG: No results found for this basename: GLUCAP,  in the last 168 hours  Radiological Exams on Admission: Ct Abdomen Pelvis W Contrast  08/06/2013   CLINICAL DATA:  Back pain, hypertension, recent urinary tract infection, prior  appendectomy  EXAM: CT ABDOMEN AND PELVIS WITH CONTRAST  TECHNIQUE: Multidetector CT imaging of the abdomen and pelvis was performed using the standard protocol following bolus administration of intravenous contrast.  CONTRAST:  OMNIPAQUE IOHEXOL 300 MG/ML SOLN, 50mL OMNIPAQUE IOHEXOL 300 MG/ML SOLN  COMPARISON:  None.  FINDINGS: Minor basilar atelectasis. No lower lobe pneumonia. Pectus excavatum deformity of the chest. Normal heart size. No pericardial or pleural effusion.  Abdomen: Liver, gallbladder, biliary system, pancreas, adrenal glands are within normal limits for age and demonstrate no acute process.  Spleen demonstrates a peripherally calcified cyst posteriorly measuring 4.1 cm, image 13. Additional subcentimeter 10 mm cyst in the spleen anteriorly. Kidneys demonstrate no renal obstruction or hydronephrosis. Left kidney has a punctate nonobstructing calculus in the lower pole. Right kidney demonstrates a lower pole cortical cyst measuring 10 mm. Left ureter demonstrates Minor strandy edema and ureteral mucosal enhancement diffusely. No obstructing stone. Left ureter appearance is suspicious for ascending urinary tract infection.  Negative for bowel obstruction, dilatation, or ileus.  No free air.  No abdominal free fluid, fluid collection, hemorrhage, abscess, or adenopathy.  Negative for aneurysm.  Pelvis: Bilateral adnexal complex cyst noted of the ovaries. Dominant cyst on the right ovary measures 4.6 x 4.4 cm. Larger cyst on the left ovary measures 11 x 5 cm. These are suspicious for polycystic ovaries. Additional considerations would include a hydrosalpinx. Consider follow-up pelvic ultrasound. Uterus is normal in size. Urinary bladder unremarkable. No pelvic free fluid, hemorrhage, hematoma, adenopathy, inguinal abnormality, or hernia.  No acute or abnormal osseous finding.  IMPRESSION: Left ureteral strandy edema and enhancement concerning for ascending left urinary tract infection or  pyelonephritis.  No associated obstructing ureteral calculus  Incidental punctate nonobstructing left intrarenal calculus  Calcified minimally complex splenic cyst measuring 4.1 cm  Pectus excavatum deformity of the lower chest  Bilateral cystic adnexal ovarian abnormalities by CT could represent polycystic ovaries and or hydrosalpinx. Recommend follow-up nonemergent pelvic ultrasound.  No pelvic free fluid   Electronically Signed   By: Ruel Favors M.D.   On: 08/06/2013 01:10   Dg Chest Port 1 View  08/05/2013   CLINICAL DATA:  Back pain, tremors  EXAM: PORTABLE CHEST - 1 VIEW  COMPARISON:  None.  FINDINGS: The heart size and mediastinal contours are within normal limits. Both lungs are clear. The visualized skeletal structures are unremarkable.  IMPRESSION: No active disease.   Electronically Signed   By: Ruel Favors M.D.   On: 08/05/2013 23:21    Assessment/Plan Active Problems:   Pyelonephritis   Anemia   1. Left pyelonephritis - patient has been placed on ceftriaxone IV. Follow urine cultures. I have placed patient on pain with medications and gentle hydration. Advance diet as tolerated. If patient's pain improves and is able to tolerate diet that patient may probably discharge  home later in the day with oral antibiotics. 2. Anemia - patient states that she has heavy menstrual cycle. Check anemia panel. If there is no significant fall in hemoglobin then further workup as outpatient.   Code Status: Full code  Family Communication: None.  Disposition Plan: Admit for observation.    Latice Waitman N. [facility] Pager [phone].  If 7PM-7AM, please contact night-coverage [email or web address] Password [email or web address] 08/06/2013, 5:11 AM

## 2013-08-06 NOTE — Progress Notes (Signed)
UR completed. Patient changed to inpatient- requiring IVF @ 100cc/hr and IV antibiotics.  

## 2013-08-06 NOTE — Progress Notes (Signed)
ANTIBIOTIC CONSULT NOTE - INITIAL  Pharmacy Consult for Ceftriaxone Indication: rule out sepsis  Allergies  Allergen Reactions  . Morphine And Related Itching    Patient Measurements:   Adjusted Body Weight:   Vital Signs: Temp: 100 F (37.8 C) (01/26 2210) Temp src: Oral (01/26 2210) BP: 109/61 mmHg (01/27 0100) Pulse Rate: 109 (01/27 0100) Intake/Output from previous day:   Intake/Output from this shift:    Labs:  Recent Labs  08/05/13 2205  WBC 12.0*  HGB 10.9*  PLT 244  CREATININE 0.53   CrCl is unknown because there is no height on file for the current visit. No results found for this basename: VANCOTROUGH, VANCOPEAK, VANCORANDOM, GENTTROUGH, GENTPEAK, GENTRANDOM, TOBRATROUGH, TOBRAPEAK, TOBRARND, AMIKACINPEAK, AMIKACINTROU, AMIKACIN,  in the last 72 hours   Microbiology: No results found for this or any previous visit (from the past 720 hour(s)).  Medical History: History reviewed. No pertinent past medical history.  Medications:  Anti-infectives   Start     Dose/Rate Route Frequency Ordered Stop   08/06/13 2200  cefTRIAXone (ROCEPHIN) 2 g in dextrose 5 % 50 mL IVPB     2 g 100 mL/hr over 30 Minutes Intravenous Daily at bedtime 08/06/13 0150     08/05/13 2230  cefTRIAXone (ROCEPHIN) 2 g in dextrose 5 % 50 mL IVPB     2 g 100 mL/hr over 30 Minutes Intravenous  Once 08/05/13 2216 08/05/13 2351     Assessment: Patient with order for ceftriaxone for sepsis.  No MD note yet.  First dose of antibiotics already given.  Goal of Therapy:  Ceftriaxone based on manufacturer dosing  Plan:  Ceftriaxone 2gm iv q24h4   Aleene DavidsonGrimsley Jr, Ahmyah Gidley Crowford 08/06/2013,1:51 AM

## 2013-08-06 NOTE — ED Provider Notes (Signed)
Medical screening examination/treatment/procedure(s) were performed by non-physician practitioner and as supervising physician I was immediately available for consultation/collaboration.  EKG Interpretation    Date/Time:    Ventricular Rate:    PR Interval:    QRS Duration:   QT Interval:    QTC Calculation:   R Axis:     Text Interpretation:                Layla MawKristen N Haylie Mccutcheon, DO 08/06/13 1730

## 2013-08-06 NOTE — Progress Notes (Signed)
CRITICAL VALUE ALERT  Critical value received:  Postive blood cx  Date of notification:  08/06/2013  Time of notification:  1415  Critical value read back:yes  Nurse who received alert:  Charlynne Panderegina Pristine Gladhill, RN  MD notified (1st page):  Woods   Time of first page:  1505  MD notified (2nd page): Woods  Time of second page: 1535  Responding MD:  No response  Time MD responded:  No response

## 2013-08-06 NOTE — ED Notes (Signed)
Patient transported to CT 

## 2013-08-07 DIAGNOSIS — B9620 Unspecified Escherichia coli [E. coli] as the cause of diseases classified elsewhere: Secondary | ICD-10-CM | POA: Diagnosis present

## 2013-08-07 DIAGNOSIS — D649 Anemia, unspecified: Secondary | ICD-10-CM | POA: Diagnosis present

## 2013-08-07 DIAGNOSIS — A498 Other bacterial infections of unspecified site: Secondary | ICD-10-CM

## 2013-08-07 DIAGNOSIS — R7881 Bacteremia: Secondary | ICD-10-CM | POA: Diagnosis present

## 2013-08-07 DIAGNOSIS — E876 Hypokalemia: Secondary | ICD-10-CM | POA: Diagnosis present

## 2013-08-07 NOTE — Progress Notes (Signed)
[facility] PROGRESS NOTE   Renee Chambers ZOX:[id number] DOB: 1966-01-18 DOA: 08/05/2013 PCP: Gaye Alken, MD  Brief narrative: Renee Chambers is an 48 y.o. female with no significant PMH who was admitted on 08/06/13 with left-sided flank pain and chills. Urinalysis showed findings consistent with UTI. CT scan of the abdomen and pelvis showed possible a sending infection of the left genitourinary tract.  Assessment/Plan: Principal Problem:   Bacteremia, escherichia coli secondary to pyelonephritis Continue ceftriaxone. Followup final culture sensitivities. Active Problems:   Depression Continue Zoloft.   Hypokalemia Monitor replace potassium as needed.   Normocytic anemia Anemia panel consistent with iron deficiency, likely from menstrual losses.  Code Status: Full. Family Communication: Husband, mother at bedside. Disposition Plan: Home when stable   IV access:  Peripheral IV  Medical Consultants:  None.  Other Consultants:  None.  Anti-infectives:  Rocephin 08/06/13--->  HPI/Subjective: Renee Chambers is still having some lower back pain. Had some diarrhea yesterday but reports he had taken a laxative thinking that her back pain is from constipation, prior to coming in. No further fever or rigors.  Appetite is good. No shortness of breath or cough.  Objective: Filed Vitals:   08/06/13 0453 08/06/13 1415 08/06/13 2205 08/07/13 0543  BP: 90/51 120/82 146/72 125/68  Pulse: 89 97 95 88  Temp: 97.9 F (36.6 C) 97.6 F (36.4 C) 98.6 F (37 C) 98.2 F (36.8 C)  TempSrc: Oral Oral Oral Oral  Resp: 18 18 18 18   Height:      Weight:      SpO2: 97% 99% 100% 97%    Intake/Output Summary (Last 24 hours) at 08/07/13 8119 Last data filed at 08/07/13 0600  Gross per 24 hour  Intake   3023 ml  Output      0 ml  Net   3023 ml    Exam: Gen:  NAD Cardiovascular:  RRR, No M/R/G Respiratory:  Lungs CTAB Gastrointestinal:  Abdomen soft,  NT/ND, + BS Extremities:  No C/E/C  Data Reviewed: Basic Metabolic Panel:  Recent Labs Lab 08/05/13 2205 08/06/13 0625  NA 137 141  K 3.4* 4.0  CL 100 106  CO2 24 25  GLUCOSE 121* 129*  BUN 10 6  CREATININE 0.53 0.53  CALCIUM 8.9 7.8*   GFR Estimated Creatinine Clearance: 84.6 ml/min (by C-G formula based on Cr of 0.53). Liver Function Tests:  Recent Labs Lab 08/05/13 2205 08/06/13 0625  AST 13 10  ALT 11 9  ALKPHOS 73 61  BILITOT 0.5 0.3  PROT 7.2 6.1  ALBUMIN 3.6 2.8*   CBC:  Recent Labs Lab 08/05/13 2205 08/06/13 0625  WBC 12.0* 11.2*  NEUTROABS 10.5* 8.7*  HGB 10.9* 10.1*  HCT 33.4* 31.3*  MCV 82.3 83.2  PLT 244 217   Anemia work up  Recent Labs  08/06/13 0625  VITAMINB12 549  FOLATE 11.9  FERRITIN 32  TIBC 306  IRON 10*  RETICCTPCT 1.3   Microbiology Recent Results (from the past 240 hour(s))  URINE CULTURE     Status: None   Collection Time    08/05/13  9:00 PM      Result Value Range Status   Specimen Description URINE, CLEAN CATCH   Final   Special Requests NONE   Final   Culture  Setup Time     Final   Value: 08/06/2013 01:13     Performed at [facility] Count     Final   Value: >=100,000 COLONIES/ML  [facility]formed at [facility]   Culture     Final   Value: ESCHERICHIA COLI     [facility]formed at [facility]   Report Status PENDING   Incomplete  CULTURE, BLOOD (ROUTINE X 2)     Status: None   Collection Time    08/05/13 11:25 PM      Result Value Range Status   Specimen Description BLOOD RIGHT ANTECUBITAL   Final   Special Requests BOTTLES DRAWN AEROBIC AND ANAEROBIC 5CC   Final   Culture  Setup Time     Final   Value: 08/06/2013 03:04     [facility]formed at [facility]   Culture     Final   Value: GRAM NEGATIVE RODS     Note: Gram Stain Report Called to,Read Back By and Verified With: REGINA BALDWIN@1414  ON [id number] BY [facility]     Performed at [facility]   Report Status PENDING    Incomplete  CULTURE, BLOOD (ROUTINE X 2)     Status: None   Collection Time    08/05/13 11:30 PM      Result Value Range Status   Specimen Description BLOOD RIGHT HAND   Final   Special Requests BOTTLES DRAWN AEROBIC AND ANAEROBIC 4CC   Final   Culture  Setup Time     Final   Value: 08/06/2013 03:04     Performed at [facility]   Culture     Final   Value:        BLOOD CULTURE RECEIVED NO GROWTH TO DATE CULTURE WILL BE HELD FOR 5 DAYS BEFORE ISSUING A FINAL NEGATIVE REPORT     Performed at [facility]   Report Status PENDING   Incomplete     Procedures and Diagnostic Studies:  Ct Abdomen Pelvis W Contrast 08/06/2013  IMPRESSION: Left ureteral strandy edema and enhancement concerning for ascending left urinary tract infection or pyelonephritis.  No associated obstructing ureteral calculus  Incidental punctate nonobstructing left intrarenal calculus  Calcified minimally complex splenic cyst measuring 4.1 cm  Pectus excavatum deformity of the lower chest  Bilateral cystic adnexal ovarian abnormalities by CT could represent polycystic ovaries and or hydrosalpinx. Recommend follow-up nonemergent pelvic ultrasound.  No pelvic free fluid   Electronically Signed   By: Ruel Favorsrevor  Shick M.D.   On: 08/06/2013 01:10    Dg Chest Port 1 View 08/05/2013 IMPRESSION: No active disease.   Electronically Signed   By: Ruel Favorsrevor  Shick M.D.   On: 08/05/2013 23:21    Scheduled Meds: . cefTRIAXone (ROCEPHIN)  IV  2 g Intravenous Q24H  . cyclobenzaprine  10 mg Oral QHS  . enoxaparin (LOVENOX) injection  40 mg Subcutaneous Q24H  . influenza vac split quadrivalent PF  0.5 mL Intramuscular Tomorrow-1000  . montelukast  10 mg Oral QHS  . sertraline  100 mg Oral QHS   Continuous Infusions:   Time spent: 35 minutes with > 50% of time discussing current diagnostic test results, clinical impression and plan of care.    LOS: 2 days   Renee Chambers  [facility] Pager [phone].   *Please  note that the hospitalists switch teams on Wednesdays. Please call the flow manager at [phone] if you are having difficulty reaching the hospitalist taking care of this patient as she can update you and provide the most up-to-date pager number of provider caring for the patient. If 8PM-8AM, please contact night-coverage at [email or web address], password [email or web address]  08/07/2013, 8:33 AM  Information printed out, explained, and given to the patient:  In an effort to keep you and your family informed about your hospital stay, I am providing you with this information sheet. If you or your family have any questions, please do not hesitate to have the nursing staff page me to set up a meeting time.  Renee Chambers 08/07/2013 2 (Number of days in the hospital)  Treatment team:  Dr. Hillery Aldo, Hospitalist (Internist)   Active Treatment Issues with Plan: Principal Problem:   Bacteria in the blood, kidney infection Continue ceftriaxone (antibiotics). Followup final culture sensitivities, which will tell us which antibiotics the most effective to treat the bacteria causing the problem. Active Problems:   Depression Continue Zoloft.   Low potassium Monitor replace potassium as needed.   Low blood count (an anemia) Anemia panel test consistent with iron deficiency, likely from menstrual losses.  Anticipated discharge date: 1-2 days depending on symptoms and final test results.

## 2013-08-08 LAB — CULTURE, BLOOD (ROUTINE X 2)

## 2013-08-08 LAB — URINE CULTURE

## 2013-08-08 MED ORDER — HYDROCODONE-ACETAMINOPHEN 5-325 MG PO TABS: 1.0000 | ORAL_TABLET | Freq: Four times a day (QID) | ORAL | Status: DC | PRN

## 2013-08-08 MED ORDER — CIPROFLOXACIN HCL 500 MG PO TABS: 500.0000 mg | ORAL_TABLET | Freq: Two times a day (BID) | ORAL | Status: DC

## 2013-08-08 NOTE — Discharge Instructions (Signed)
Bacteremia Bacteremia occurs when bacteria get in your blood. Normal blood does not usually have bacteria. Bacteremia is one way infections can spread from one part of the body to another. CAUSES   Causes may include anything that allows bacteria to get into the body. Examples are:  Catheters.  Intravenous (IV) access tubes.  Cuts or scrapes of the skin.  Temporary bacteremia may occur during dental procedures, while brushing your teeth, or during a bowel movement. This rarely causes any symptoms or medical problems.  Bacteria may also get in the bloodstream as a complication of a bacterial infection elsewhere. This includes infected wounds and bacterial infections of the:  Lungs (pneumonia).  Kidneys (pyelonephritis).  Intestines (enteritis, colitis).  Organs in the abdomen (appendicitis, cholecystitis, diverticulitis). SYMPTOMS  The body is usually able to clear small numbers of bacteria out of the blood quickly. Brief bacteremia usually does not cause problems.   Problems can occur if the bacteria start to grow in number or spread to other parts of the body. If the bacteria start growing, you may develop:  Chills.  Fever.  Nausea.  Vomiting.  Sweating.  Lightheadedness and low blood pressure.  Pain.  If bacteria start to grow in the linings around the brain, it is called meningitis. This can cause severe headaches, many other problems, and even death.  If bacteria start to grow in a joint, it causes arthritis with painful joints. If bacteria start to grow in a bone, it is called osteomyelitis.  Bacteria from the blood can also cause sores (abscesses) in many organs, such as the muscle, liver, spleen, lungs, brain, and kidneys. DIAGNOSIS   This condition is diagnosed by cultures of the blood.  Cultures may also be taken from other parts of the body that are thought to be causing the bacteremia. A small piece of tissue, fluid, or other product of the body is  sampled. The sample is then put on a growth plate to see if any bacteria grows.  Other lab tests may be done and the results may be abnormal. TREATMENT  Treatment requires a stay in the hospital. You will be given antibiotic medicine through an IV access tube. PREVENTION  People with an increased risk of developing bacteremia or complications may be given antibiotics before certain procedures. Examples are:  A person with a heart murmur or artificial heart valve, before having his or her teeth cleaned.  Before having a surgical or other invasive procedure.  Before having a bowel procedure. Document Released: 04/10/2006 Document Revised: 09/19/2011 Document Reviewed: 01/20/2011 [facility] Patient Information 2014 [place], [place].  Pyelonephritis, Adult Pyelonephritis is a kidney infection. In general, there are 2 main types of pyelonephritis:  Infections that come on quickly without any warning (acute pyelonephritis).  Infections that persist for a long period of time (chronic pyelonephritis). CAUSES  Two main causes of pyelonephritis are:  Bacteria traveling from the bladder to the kidney. This is a problem especially in pregnant women. The urine in the bladder can become filled with bacteria from multiple causes, including:  Inflammation of the prostate gland (prostatitis).  Sexual intercourse in females.  Bladder infection (cystitis).  Bacteria traveling from the bloodstream to the tissue part of the kidney. Problems that may increase your risk of getting a kidney infection include:  Diabetes.  Kidney stones or bladder stones.  Cancer.  Catheters placed in the bladder.  Other abnormalities of the kidney or ureter. SYMPTOMS   Abdominal pain.  Pain in the side or flank area.  Fever.  Chills.  Upset stomach.  Blood in the urine (dark urine).  Frequent urination.  Strong or persistent urge to urinate.  Burning or stinging when urinating. DIAGNOSIS  Your  caregiver may diagnose your kidney infection based on your symptoms. A urine sample may also be taken. TREATMENT  In general, treatment depends on how severe the infection is.   If the infection is mild and caught early, your caregiver may treat you with oral antibiotics and send you home.  If the infection is more severe, the bacteria may have gotten into the bloodstream. This will require intravenous (IV) antibiotics and a hospital stay. Symptoms may include:  High fever.  Severe flank pain.  Shaking chills.  Even after a hospital stay, your caregiver may require you to be on oral antibiotics for a period of time.  Other treatments may be required depending upon the cause of the infection. HOME CARE INSTRUCTIONS   Take your antibiotics as directed. Finish them even if you start to feel better.  Make an appointment to have your urine checked to make sure the infection is gone.  Drink enough fluids to keep your urine clear or pale yellow.  Take medicines for the bladder if you have urgency and frequency of urination as directed by your caregiver. SEEK IMMEDIATE MEDICAL CARE IF:   You have a fever or persistent symptoms for more than 2-3 days.  You have a fever and your symptoms suddenly get worse.  You are unable to take your antibiotics or fluids.  You develop shaking chills.  You experience extreme weakness or fainting.  There is no improvement after 2 days of treatment. MAKE SURE YOU:  Understand these instructions.  Will watch your condition.  Will get help right away if you are not doing well or get worse. Document Released: 06/27/2005 Document Revised: 12/27/2011 Document Reviewed: 12/01/2010 [facility] Patient Information 2014 [place].

## 2013-08-08 NOTE — Progress Notes (Signed)
Pt. Was discharged home she was given her discharge instructions, prescriptions, and all questions were answered. She was transported home by family.  

## 2013-08-08 NOTE — Discharge Summary (Signed)
Physician Discharge Summary  Renee GarrisonCarol Chambers ZDG:[id number] DOB: 12/08/1965 DOA: 08/05/2013  PCP: Gaye AlkenBARNES,ELIZABETH STEWART, MD  Admit date: 08/05/2013 Discharge date: 08/08/2013  Recommendations for Outpatient Follow-up:  1. F/U with PCP for nonresolution of symptoms. 2. Nonurgent followup pelvic ultrasound recommended for evaluation of bilateral cystic adnexal ovarian abnormalities.  Discharge Diagnoses:  Principal Problem:    Bacteremia, escherichia coli secondary to pyelonephritis Active Problems:    Pyelonephritis    Depression    Hypokalemia    Normocytic anemia  Discharge Condition: Improved.  Diet recommendation: Regular.  History of present illness:  Renee Chambers is an 48 y.o. female with no significant PMH who was admitted on 08/06/13 with left-sided flank pain and chills. Urinalysis showed findings consistent with UTI. CT scan of the abdomen and pelvis showed possible a sending infection of the left genitourinary tract.  Hospital Course by problem:  Principal Problem:  Bacteremia, escherichia coli secondary to pyelonephritis  CT scan done on admission showed findings consistent with left UTI/pyelonephritis..  Active Problems:  Depression  Continue Zoloft.  Hypokalemia  Monitor replace potassium as needed.  Normocytic anemia  Anemia panel consistent with iron deficiency, likely from menstrual losses.  Procedures and Diagnostic Studies:  Ct Abdomen Pelvis W Contrast 08/06/2013 IMPRESSION: Left ureteral strandy edema and enhancement concerning for ascending left urinary tract infection or pyelonephritis. No associated obstructing ureteral calculus Incidental punctate nonobstructing left intrarenal calculus Calcified minimally complex splenic cyst measuring 4.1 cm Pectus excavatum deformity of the lower chest Bilateral cystic adnexal ovarian abnormalities by CT could represent polycystic ovaries and or hydrosalpinx. Recommend follow-up nonemergent pelvic  ultrasound. No pelvic free fluid Electronically Signed By: Ruel Favorsrevor Shick M.D. On: 08/06/2013 01:10  Dg Chest Port 1 View 08/05/2013 IMPRESSION: No active disease. Electronically Signed By: Ruel Favorsrevor Shick M.D. On: 08/05/2013 23:21    Consultations:  None.  Discharge Exam: Filed Vitals:   08/08/13 0459  BP: 107/60  Pulse: 85  Temp: 97.8 F (36.6 C)  Resp: 16   Filed Vitals:   08/07/13 0543 08/07/13 1400 08/07/13 2208 08/08/13 0459  BP: 125/68 109/80 129/81 107/60  Pulse: 88 100 87 85  Temp: 98.2 F (36.8 C) 98.5 F (36.9 C) 97.5 F (36.4 C) 97.8 F (36.6 C)  TempSrc: Oral Oral Oral Oral  Resp: 18 18 18 16   Height:      Weight:      SpO2: 97% 96% 99% 98%    Gen:  NAD Cardiovascular:  RRR, No M/R/G Respiratory: Lungs CTAB Gastrointestinal: Abdomen soft, NT/ND with normal active bowel sounds. Extremities: No C/E/C   Discharge Instructions  Discharge Orders   Future Orders Complete By Expires   Call MD for:  persistant nausea and vomiting  As directed    Call MD for:  severe uncontrolled pain  As directed    Call MD for:  temperature >100.4  As directed    Diet general  As directed    Discharge instructions  As directed    Comments:     You were cared for by Dr. Hillery Aldohristina Rama  (a hospitalist) during your hospital stay. If you have any questions about your discharge medications or the care you received while you were in the hospital after you are discharged, you can call the unit and ask to speak with the hospitalist on call if the hospitalist that took care of you is not available. Once you are discharged, your primary care physician will handle any further medical issues. Please note that NO REFILLS for any discharge  medications will be authorized once you are discharged, as it is imperative that you return to your primary care physician (or establish a relationship with a primary care physician if you do not have one) for your aftercare needs so that they can reassess your  need for medications and monitor your lab values.  Any outstanding tests can be reviewed by your PCP at your follow up visit.  It is also important to review any medicine changes with your PCP.  Please bring these d/c instructions with you to your next visit so your physician can review these changes with you.  If you do not have a primary care physician, you can call [phone] for a physician referral.  It is highly recommended that you obtain a PCP for hospital follow up.   Increase activity slowly  As directed        Medication List         ciprofloxacin 500 MG tablet  Commonly known as:  CIPRO  Take 1 tablet (500 mg total) by mouth 2 (two) times daily.     cyclobenzaprine 10 MG tablet  Commonly known as:  FLEXERIL  Take 10 mg by mouth at bedtime.     HYDROcodone-acetaminophen 5-325 MG per tablet  Commonly known as:  NORCO/VICODIN  Take 1 tablet by mouth every 6 (six) hours as needed for moderate pain.     ibuprofen 200 MG tablet  Commonly known as:  ADVIL,MOTRIN  Take 400 mg by mouth every 6 (six) hours as needed for moderate pain.     montelukast 10 MG tablet  Commonly known as:  SINGULAIR  Take 10 mg by mouth at bedtime.     sertraline 100 MG tablet  Commonly known as:  ZOLOFT  Take 100 mg by mouth at bedtime.           Follow-up Information   Schedule an appointment as soon as possible for a visit with Gaye Alken, MD. (As needed)    Specialty:  Family Medicine   Contact information:   [address] [place] [address] [phone]        The results of significant diagnostics from this hospitalization (including imaging, microbiology, ancillary and laboratory) are listed below for reference.    Labs:  Basic Metabolic Panel:  Recent Labs Lab 08/05/13 2205 08/06/13 0625  NA 137 141  K 3.4* 4.0  CL 100 106  CO2 24 25  GLUCOSE 121* 129*  BUN 10 6  CREATININE 0.53 0.53  CALCIUM 8.9 7.8*   GFR Estimated Creatinine Clearance:  84.6 ml/min (by C-G formula based on Cr of 0.53). Liver Function Tests:  Recent Labs Lab 08/05/13 2205 08/06/13 0625  AST 13 10  ALT 11 9  ALKPHOS 73 61  BILITOT 0.5 0.3  PROT 7.2 6.1  ALBUMIN 3.6 2.8*   CBC:  Recent Labs Lab 08/05/13 2205 08/06/13 0625  WBC 12.0* 11.2*  NEUTROABS 10.5* 8.7*  HGB 10.9* 10.1*  HCT 33.4* 31.3*  MCV 82.3 83.2  PLT 244 217   Anemia work up  Recent Labs  08/06/13 0625  VITAMINB12 549  FOLATE 11.9  FERRITIN 32  TIBC 306  IRON 10*  RETICCTPCT 1.3   Microbiology Recent Results (from the past 240 hour(s))  URINE CULTURE     Status: None   Collection Time    08/05/13  9:00 PM      Result Value Range Status   Specimen Description URINE, CLEAN CATCH   Final  Special Requests NONE   Final   Culture  Setup Time     Final   Value: 08/06/2013 01:13     Performed at [facility] Count     Final   Value: >=100,000 COLONIES/ML     Performed at [facility]   Culture     Final   Value: ESCHERICHIA COLI     Performed at [facility]   Report Status 08/08/2013 FINAL   Final   Organism ID, Bacteria ESCHERICHIA COLI   Final  CULTURE, BLOOD (ROUTINE X 2)     Status: None   Collection Time    08/05/13 11:25 PM      Result Value Range Status   Specimen Description BLOOD RIGHT ANTECUBITAL   Final   Special Requests BOTTLES DRAWN AEROBIC AND ANAEROBIC 5CC   Final   Culture  Setup Time     Final   Value: 08/06/2013 03:04     Performed at [facility]   Culture     Final   Value: ESCHERICHIA COLI     Note: Gram Stain Report Called to,Read Back By and Verified With: REGINA BALDWIN@1414  ON [id number] BY [facility]     Performed at [facility]   Report Status 08/08/2013 FINAL   Final   Organism ID, Bacteria ESCHERICHIA COLI   Final  CULTURE, BLOOD (ROUTINE X 2)     Status: None   Collection Time    08/05/13 11:30 PM      Result Value Range Status   Specimen Description BLOOD RIGHT HAND   Final    Special Requests BOTTLES DRAWN AEROBIC AND ANAEROBIC 4CC   Final   Culture  Setup Time     Final   Value: 08/06/2013 03:04     Performed at [facility]   Culture     Final   Value:        BLOOD CULTURE RECEIVED NO GROWTH TO DATE CULTURE WILL BE HELD FOR 5 DAYS BEFORE ISSUING A FINAL NEGATIVE REPORT     Performed at [facility]   Report Status PENDING   Incomplete    Time coordinating discharge: 35 minutes.  Signed:  RAMA,CHRISTINA  Pager [phone] [facility] 08/08/2013, 9:21 AM

## 2013-08-12 LAB — CULTURE, BLOOD (ROUTINE X 2): CULTURE: NO GROWTH

## 2013-11-15 ENCOUNTER — Encounter (HOSPITAL_COMMUNITY): Payer: Self-pay | Admitting: Pharmacist

## 2013-11-22 ENCOUNTER — Encounter (HOSPITAL_COMMUNITY)
Admission: RE | Admit: 2013-11-22 | Discharge: 2013-11-22 | Disposition: A | Discharge status: Home / Self Care | Payer: BC Managed Care – PPO | Source: Ambulatory Visit | Attending: Obstetrics and Gynecology | Admitting: Obstetrics and Gynecology

## 2013-11-22 ENCOUNTER — Encounter (HOSPITAL_COMMUNITY): Payer: Self-pay

## 2013-11-22 DIAGNOSIS — Z01812 Encounter for preprocedural laboratory examination: Secondary | ICD-10-CM | POA: Insufficient documentation

## 2013-11-22 LAB — BASIC METABOLIC PANEL
BUN: 14 mg/dL (ref 6–23)
CALCIUM: 9.1 mg/dL (ref 8.4–10.5)
CO2: 28 mEq/L (ref 19–32)
Chloride: 103 mEq/L (ref 96–112)
Creatinine, Ser: 0.57 mg/dL (ref 0.50–1.10)
Glucose, Bld: 91 mg/dL (ref 70–99)
Potassium: 4.6 mEq/L (ref 3.7–5.3)
SODIUM: 140 meq/L (ref 137–147)

## 2013-11-22 LAB — CBC
HCT: 34.7 % — ABNORMAL LOW (ref 36.0–46.0)
Hemoglobin: 11.2 g/dL — ABNORMAL LOW (ref 12.0–15.0)
MCH: 26.5 pg (ref 26.0–34.0)
MCHC: 32.3 g/dL (ref 30.0–36.0)
MCV: 82 fL (ref 78.0–100.0)
PLATELETS: 253 10*3/uL (ref 150–400)
RBC: 4.23 MIL/uL (ref 3.87–5.11)
RDW: 13.6 % (ref 11.5–15.5)
WBC: 8.6 10*3/uL (ref 4.0–10.5)

## 2013-11-22 LAB — ABO/RH: ABO/RH(D): A POS

## 2013-11-22 NOTE — Patient Instructions (Signed)
20 Renee GarrisonCarol Chambers  11/22/2013   Your procedure is scheduled on:  11/26/13  [facility].  Pick up the phone at the desk and dial [phone].   Call this number if you have problems the morning of surgery: [phone]   Remember:   Do not eat food:After Midnight.  Do not drink clear liquids: After Midnight.  Take these medicines the morning of surgery with A SIP OF WATER: NA   Do not wear jewelry, make-up or nail polish.  Do not wear lotions, powders, or perfumes. You may wear deodorant.  Do not shave 48 hours prior to surgery.  Do not bring valuables to the hospital.  [facility] not   responsible for any belongings or valuables brought to the hospital.  Contacts, dentures or bridgework may not be worn into surgery.  Leave suitcase in the car. After surgery it may be brought to your room.  For patients admitted to the hospital, checkout time is 11:00 AM the day of              discharge.   Patients discharged the day of surgery will not be allowed to drive             home.  Name and phone number of your driver: NA  Special Instructions:      Please read over the following fact sheets that you were given:   Surgical Site Infection Prevention

## 2013-11-25 NOTE — H&P (Signed)
Renee GarrisonCarol Law is an 48 y.o. female. G2P2 who presents for a scheduled LAVH/Possible BSO/possible TAH for pelvic pain, menorrhagia and probable bilateral endometriomas.  The patient had a CT Scan in 1/15 when she had urosepsis and the ovarian cysts were identified.  Since that time, she had noted worsening pelvic pain, back pain and menorrhagia.  US 3/15 showed bilateral ovarian cysts.  The left measuring 8cm with no normal ovarian tissue seen and the right measuring 3-4 cm.--both with appearance of endometriomas. Pain is now pretty much daily.  She has a h/o endometriosis in the past and both a laparoscopy for this as well as a laparotomy with bilateral ovarian cystectomies.  She also had a surgery with Appendectomy in 2012 and was told by the surgeon she had a lot of scarring and inflammation from her endometriosis.  She would like definitve surgery at this point but would like to retain an ovary if possible for hormonal function.  Pertinent Gynecological History:  OB History:  NSVD x 2   Menstrual History:  No LMP recorded.    Past Medical History  Diagnosis Date  . Medical history non-contributory     Past Surgical History  Procedure Laterality Date  . Appendectomy    . Ovarian cyst removal      Family History  Problem Relation Age of Onset  . Hypertension Father   . CAD Father     Social History:  reports that she has never smoked. She does not have any smokeless tobacco history on file. She reports that she does not drink alcohol or use illicit drugs.  Allergies:  Allergies  Allergen Reactions  . Morphine And Related   . Morphine And Related Itching    No prescriptions prior to admission    Review of Systems  Gastrointestinal: Positive for abdominal pain.  Genitourinary:       Menorrhagia  Musculoskeletal: Positive for back pain.    There were no vitals taken for this visit. Physical Exam  Constitutional: She is oriented to person, place, and time. She appears  well-developed and well-nourished.  Cardiovascular: Normal rate and regular rhythm.   Respiratory: Effort normal and breath sounds normal.  GI: Soft.  Genitourinary: Vagina normal and uterus normal.  Tender adnexal masses bilaterally  Neurological: She is alert and oriented to person, place, and time.  Psychiatric: She has a normal mood and affect.    No results found for this or any previous visit (from the past 24 hour(s)).  No results found.  Assessment/Plan: The patient was counseled regarding the risks of laparoscopic assisted vaginal hysterectomy, The procedure was reviewed in detail and expectations regarding recovery. Risks of bleeding, infection and possible damage to bowel and bladder were reviewed. The patient understands that should a complication arise she would likely need a larger abdominal incision and that this would delay her recovery. She would accept a blood transfusion if needed. We also discussed removal of the fallopian tubes as a means of possibly reducing future risk of ovarian cancer and she is agreeable to this. She knows we will remove the left ovary and possibly the right if no ovarian tissue looks normal, and that this would result in menopause. She also knows that there is a fair chance she will need an open approach if her scarring is extensive. She will perform a bowel prep the night before surgery. She is ready to proceed.   Oliver PilaKathy W Jakhai Fant 11/25/2013, 1:21 PM

## 2013-11-26 ENCOUNTER — Encounter (HOSPITAL_COMMUNITY): Payer: Self-pay

## 2013-11-26 ENCOUNTER — Encounter (HOSPITAL_COMMUNITY): Payer: BC Managed Care – PPO | Admitting: Anesthesiology

## 2013-11-26 ENCOUNTER — Ambulatory Visit (HOSPITAL_COMMUNITY): Payer: BC Managed Care – PPO | Admitting: Anesthesiology

## 2013-11-26 ENCOUNTER — Encounter (HOSPITAL_COMMUNITY)
Admission: RE | Disposition: A | Discharge status: Home / Self Care | Payer: Self-pay | Source: Ambulatory Visit | Attending: Obstetrics and Gynecology

## 2013-11-26 ENCOUNTER — Inpatient Hospital Stay (HOSPITAL_COMMUNITY)
Admission: RE | Admit: 2013-11-26 | Discharge: 2013-11-28 | DRG: 743 | Disposition: A | Discharge status: Home / Self Care | Payer: BC Managed Care – PPO | Source: Ambulatory Visit | Attending: Obstetrics and Gynecology | Admitting: Obstetrics and Gynecology

## 2013-11-26 DIAGNOSIS — Z9079 Acquired absence of other genital organ(s): Secondary | ICD-10-CM

## 2013-11-26 DIAGNOSIS — N736 Female pelvic peritoneal adhesions (postinfective): Secondary | ICD-10-CM | POA: Diagnosis present

## 2013-11-26 DIAGNOSIS — Z90722 Acquired absence of ovaries, bilateral: Secondary | ICD-10-CM

## 2013-11-26 DIAGNOSIS — N946 Dysmenorrhea, unspecified: Secondary | ICD-10-CM | POA: Diagnosis present

## 2013-11-26 DIAGNOSIS — Z5331 Laparoscopic surgical procedure converted to open procedure: Secondary | ICD-10-CM

## 2013-11-26 DIAGNOSIS — N920 Excessive and frequent menstruation with regular cycle: Principal | ICD-10-CM | POA: Diagnosis present

## 2013-11-26 DIAGNOSIS — N80109 Endometriosis of ovary, unspecified side, unspecified depth: Secondary | ICD-10-CM | POA: Diagnosis present

## 2013-11-26 DIAGNOSIS — Z90710 Acquired absence of both cervix and uterus: Secondary | ICD-10-CM

## 2013-11-26 DIAGNOSIS — N801 Endometriosis of ovary: Secondary | ICD-10-CM | POA: Diagnosis present

## 2013-11-26 DIAGNOSIS — N809 Endometriosis, unspecified: Secondary | ICD-10-CM

## 2013-11-26 DIAGNOSIS — N949 Unspecified condition associated with female genital organs and menstrual cycle: Secondary | ICD-10-CM | POA: Diagnosis present

## 2013-11-26 DIAGNOSIS — N803 Endometriosis of pelvic peritoneum, unspecified: Secondary | ICD-10-CM | POA: Diagnosis present

## 2013-11-26 DIAGNOSIS — N83209 Unspecified ovarian cyst, unspecified side: Secondary | ICD-10-CM | POA: Diagnosis present

## 2013-11-26 DIAGNOSIS — N8000 Endometriosis of the uterus, unspecified: Secondary | ICD-10-CM | POA: Diagnosis present

## 2013-11-26 DIAGNOSIS — D649 Anemia, unspecified: Secondary | ICD-10-CM | POA: Diagnosis present

## 2013-11-26 HISTORY — PX: LAPAROSCOPIC ASSISTED VAGINAL HYSTERECTOMY: SHX5398

## 2013-11-26 LAB — PREGNANCY, URINE: Preg Test, Ur: NEGATIVE

## 2013-11-26 SURGERY — HYSTERECTOMY, VAGINAL, LAPAROSCOPY-ASSISTED
Anesthesia: General | Site: Abdomen

## 2013-11-26 MED ORDER — KETOROLAC TROMETHAMINE 30 MG/ML IJ SOLN: 15.0000 mg | Freq: Once | INTRAMUSCULAR | Status: DC | PRN

## 2013-11-26 MED ORDER — ACETAMINOPHEN 160 MG/5ML PO SOLN: 325.0000 mg | ORAL | Status: DC | PRN

## 2013-11-26 MED ORDER — MONTELUKAST SODIUM 10 MG PO TABS
10.0000 mg | ORAL_TABLET | Freq: Every day | ORAL | Status: DC
  Administered 2013-11-26 – 2013-11-27 (×2): 10 mg via ORAL
  Filled 2013-11-26 (×2): qty 1

## 2013-11-26 MED ORDER — ROCURONIUM BROMIDE 100 MG/10ML IV SOLN
INTRAVENOUS | Status: DC | PRN
  Administered 2013-11-26: 30 mg via INTRAVENOUS
  Administered 2013-11-26 (×3): 10 mg via INTRAVENOUS

## 2013-11-26 MED ORDER — MIDAZOLAM HCL 2 MG/2ML IJ SOLN
INTRAMUSCULAR | Status: DC | PRN
  Administered 2013-11-26: 2 mg via INTRAVENOUS

## 2013-11-26 MED ORDER — LIDOCAINE HCL (CARDIAC) 20 MG/ML IV SOLN
INTRAVENOUS | Status: DC | PRN
  Administered 2013-11-26: 50 mg via INTRAVENOUS

## 2013-11-26 MED ORDER — STERILE WATER FOR IRRIGATION IR SOLN
Status: DC | PRN
  Administered 2013-11-26: 1000 mL via INTRAVESICAL

## 2013-11-26 MED ORDER — BUPIVACAINE HCL (PF) 0.25 % IJ SOLN
INTRAMUSCULAR | Status: AC
  Filled 2013-11-26: qty 30

## 2013-11-26 MED ORDER — ONDANSETRON HCL 4 MG/2ML IJ SOLN: 4.0000 mg | Freq: Four times a day (QID) | INTRAMUSCULAR | Status: DC | PRN

## 2013-11-26 MED ORDER — FENTANYL CITRATE 0.05 MG/ML IJ SOLN
INTRAMUSCULAR | Status: AC
  Filled 2013-11-26: qty 2

## 2013-11-26 MED ORDER — LIDOCAINE HCL (CARDIAC) 20 MG/ML IV SOLN
INTRAVENOUS | Status: AC
  Filled 2013-11-26: qty 5

## 2013-11-26 MED ORDER — HYDROMORPHONE HCL PF 1 MG/ML IJ SOLN
INTRAMUSCULAR | Status: AC
  Administered 2013-11-26: 0.5 mg via INTRAVENOUS
  Filled 2013-11-26: qty 1

## 2013-11-26 MED ORDER — HEPARIN SODIUM (PORCINE) 5000 UNIT/ML IJ SOLN
INTRAMUSCULAR | Status: AC
  Filled 2013-11-26: qty 1

## 2013-11-26 MED ORDER — BUPIVACAINE HCL (PF) 0.25 % IJ SOLN
INTRAMUSCULAR | Status: DC | PRN
  Administered 2013-11-26: 8 mL
  Administered 2013-11-26: 22 mL

## 2013-11-26 MED ORDER — HYDROMORPHONE 0.3 MG/ML IV SOLN
INTRAVENOUS | Status: DC
  Administered 2013-11-26: 0.999 mg via INTRAVENOUS
  Administered 2013-11-26: 14:00:00 via INTRAVENOUS
  Administered 2013-11-26: 1.35 mg via INTRAVENOUS
  Administered 2013-11-27: 0.599 mg via INTRAVENOUS
  Administered 2013-11-27: 0.999 mg via INTRAVENOUS
  Filled 2013-11-26: qty 25

## 2013-11-26 MED ORDER — LORATADINE 10 MG PO TABS
10.0000 mg | ORAL_TABLET | Freq: Every day | ORAL | Status: DC
  Administered 2013-11-27 – 2013-11-28 (×2): 10 mg via ORAL
  Filled 2013-11-26 (×3): qty 1

## 2013-11-26 MED ORDER — SODIUM CHLORIDE 0.9 % IJ SOLN: 9.0000 mL | INTRAMUSCULAR | Status: DC | PRN

## 2013-11-26 MED ORDER — ACETAMINOPHEN 160 MG/5ML PO SOLN
ORAL | Status: AC
  Filled 2013-11-26: qty 40.6

## 2013-11-26 MED ORDER — ROCURONIUM BROMIDE 100 MG/10ML IV SOLN
INTRAVENOUS | Status: AC
  Filled 2013-11-26: qty 1

## 2013-11-26 MED ORDER — OXYCODONE HCL 5 MG/5ML PO SOLN: 5.0000 mg | Freq: Once | ORAL | Status: DC | PRN

## 2013-11-26 MED ORDER — ACETAMINOPHEN 325 MG PO TABS: 325.0000 mg | ORAL_TABLET | ORAL | Status: DC | PRN

## 2013-11-26 MED ORDER — PROMETHAZINE HCL 25 MG/ML IJ SOLN: 6.2500 mg | INTRAMUSCULAR | Status: DC | PRN

## 2013-11-26 MED ORDER — ONDANSETRON HCL 4 MG/2ML IJ SOLN
INTRAMUSCULAR | Status: AC
  Filled 2013-11-26: qty 2

## 2013-11-26 MED ORDER — IBUPROFEN 600 MG PO TABS
600.0000 mg | ORAL_TABLET | Freq: Four times a day (QID) | ORAL | Status: DC | PRN
  Administered 2013-11-27 – 2013-11-28 (×2): 600 mg via ORAL
  Filled 2013-11-26 (×2): qty 1

## 2013-11-26 MED ORDER — FENTANYL CITRATE 0.05 MG/ML IJ SOLN
INTRAMUSCULAR | Status: AC
  Filled 2013-11-26: qty 5

## 2013-11-26 MED ORDER — ONDANSETRON HCL 4 MG/2ML IJ SOLN
INTRAMUSCULAR | Status: DC | PRN
  Administered 2013-11-26: 4 mg via INTRAVENOUS

## 2013-11-26 MED ORDER — OXYCODONE HCL 5 MG PO TABS: 5.0000 mg | ORAL_TABLET | Freq: Once | ORAL | Status: DC | PRN

## 2013-11-26 MED ORDER — VASOPRESSIN 20 UNIT/ML IJ SOLN
INTRAMUSCULAR | Status: AC
  Filled 2013-11-26: qty 1

## 2013-11-26 MED ORDER — KETOROLAC TROMETHAMINE 30 MG/ML IJ SOLN
INTRAMUSCULAR | Status: AC
  Filled 2013-11-26: qty 1

## 2013-11-26 MED ORDER — SCOPOLAMINE 1 MG/3DAYS TD PT72
MEDICATED_PATCH | TRANSDERMAL | Status: AC
Start: 2013-11-26 — End: 2013-11-26
  Filled 2013-11-26: qty 1

## 2013-11-26 MED ORDER — LACTATED RINGERS IV SOLN
INTRAVENOUS | Status: DC
  Administered 2013-11-26 – 2013-11-27 (×2): via INTRAVENOUS

## 2013-11-26 MED ORDER — DIPHENHYDRAMINE HCL 50 MG/ML IJ SOLN: 12.5000 mg | Freq: Four times a day (QID) | INTRAMUSCULAR | Status: DC | PRN

## 2013-11-26 MED ORDER — NEOSTIGMINE METHYLSULFATE 10 MG/10ML IV SOLN
INTRAVENOUS | Status: DC | PRN
  Administered 2013-11-26: 3 mg via INTRAVENOUS

## 2013-11-26 MED ORDER — PROPOFOL 10 MG/ML IV EMUL
INTRAVENOUS | Status: AC
  Filled 2013-11-26: qty 20

## 2013-11-26 MED ORDER — SIMETHICONE 80 MG PO CHEW
80.0000 mg | CHEWABLE_TABLET | Freq: Four times a day (QID) | ORAL | Status: DC | PRN
  Administered 2013-11-27 – 2013-11-28 (×4): 80 mg via ORAL
  Filled 2013-11-26 (×4): qty 1

## 2013-11-26 MED ORDER — OXYCODONE-ACETAMINOPHEN 5-325 MG PO TABS
1.0000 | ORAL_TABLET | ORAL | Status: DC | PRN
  Administered 2013-11-27 – 2013-11-28 (×4): 1 via ORAL
  Filled 2013-11-26 (×4): qty 1

## 2013-11-26 MED ORDER — SODIUM CHLORIDE 0.9 % IJ SOLN
INTRAMUSCULAR | Status: AC
  Filled 2013-11-26: qty 10

## 2013-11-26 MED ORDER — MIDAZOLAM HCL 2 MG/2ML IJ SOLN
INTRAMUSCULAR | Status: AC
  Filled 2013-11-26: qty 2

## 2013-11-26 MED ORDER — DEXAMETHASONE SODIUM PHOSPHATE 10 MG/ML IJ SOLN
INTRAMUSCULAR | Status: DC | PRN
  Administered 2013-11-26: 10 mg via INTRAVENOUS

## 2013-11-26 MED ORDER — KETOROLAC TROMETHAMINE 30 MG/ML IJ SOLN
INTRAMUSCULAR | Status: DC | PRN
  Administered 2013-11-26: 30 mg via INTRAVENOUS

## 2013-11-26 MED ORDER — ONDANSETRON HCL 4 MG PO TABS: 4.0000 mg | ORAL_TABLET | Freq: Four times a day (QID) | ORAL | Status: DC | PRN

## 2013-11-26 MED ORDER — FENTANYL CITRATE 0.05 MG/ML IJ SOLN
25.0000 ug | INTRAMUSCULAR | Status: DC | PRN
  Administered 2013-11-26 (×3): 50 ug via INTRAVENOUS

## 2013-11-26 MED ORDER — CEFAZOLIN SODIUM-DEXTROSE 2-3 GM-% IV SOLR
INTRAVENOUS | Status: AC
  Filled 2013-11-26: qty 50

## 2013-11-26 MED ORDER — CEFAZOLIN SODIUM-DEXTROSE 2-3 GM-% IV SOLR
2.0000 g | INTRAVENOUS | Status: AC
  Administered 2013-11-26: 2 g via INTRAVENOUS

## 2013-11-26 MED ORDER — LACTATED RINGERS IV SOLN
INTRAVENOUS | Status: DC
  Administered 2013-11-26: 100 mL/h via INTRAVENOUS
  Administered 2013-11-26 (×2): via INTRAVENOUS

## 2013-11-26 MED ORDER — SCOPOLAMINE 1 MG/3DAYS TD PT72
MEDICATED_PATCH | TRANSDERMAL | Status: DC | PRN
  Administered 2013-11-26: 1 via TRANSDERMAL

## 2013-11-26 MED ORDER — ACETAMINOPHEN 160 MG/5ML PO SOLN
975.0000 mg | Freq: Once | ORAL | Status: AC
  Administered 2013-11-26: 975 mg via ORAL

## 2013-11-26 MED ORDER — ESTRADIOL 0.1 MG/GM VA CREA
TOPICAL_CREAM | VAGINAL | Status: AC
  Filled 2013-11-26: qty 42.5

## 2013-11-26 MED ORDER — 0.9 % SODIUM CHLORIDE (POUR BTL) OPTIME
TOPICAL | Status: DC | PRN
  Administered 2013-11-26: 1000 mL

## 2013-11-26 MED ORDER — LACTATED RINGERS IV SOLN
INTRAVENOUS | Status: DC
  Administered 2013-11-26 (×3): via INTRAVENOUS

## 2013-11-26 MED ORDER — GLYCOPYRROLATE 0.2 MG/ML IJ SOLN
INTRAMUSCULAR | Status: AC
  Filled 2013-11-26: qty 2

## 2013-11-26 MED ORDER — SERTRALINE HCL 100 MG PO TABS
100.0000 mg | ORAL_TABLET | Freq: Every day | ORAL | Status: DC
  Administered 2013-11-26 – 2013-11-27 (×2): 100 mg via ORAL
  Filled 2013-11-26 (×2): qty 1

## 2013-11-26 MED ORDER — GLYCOPYRROLATE 0.2 MG/ML IJ SOLN
INTRAMUSCULAR | Status: AC
  Filled 2013-11-26: qty 1

## 2013-11-26 MED ORDER — NALOXONE HCL 0.4 MG/ML IJ SOLN: 0.4000 mg | INTRAMUSCULAR | Status: DC | PRN

## 2013-11-26 MED ORDER — DIPHENHYDRAMINE HCL 12.5 MG/5ML PO ELIX
12.5000 mg | ORAL_SOLUTION | Freq: Four times a day (QID) | ORAL | Status: DC | PRN
  Administered 2013-11-27: 12.5 mg via ORAL
  Filled 2013-11-26: qty 5

## 2013-11-26 MED ORDER — FENTANYL CITRATE 0.05 MG/ML IJ SOLN
INTRAMUSCULAR | Status: AC
  Administered 2013-11-26: 50 ug via INTRAVENOUS
  Filled 2013-11-26: qty 2

## 2013-11-26 MED ORDER — SODIUM CHLORIDE 0.9 % IJ SOLN
INTRAMUSCULAR | Status: AC
  Filled 2013-11-26: qty 100

## 2013-11-26 MED ORDER — HYDROMORPHONE HCL PF 1 MG/ML IJ SOLN
0.2500 mg | INTRAMUSCULAR | Status: DC | PRN
  Administered 2013-11-26 (×2): 0.5 mg via INTRAVENOUS

## 2013-11-26 MED ORDER — PROPOFOL 10 MG/ML IV BOLUS
INTRAVENOUS | Status: DC | PRN
  Administered 2013-11-26: 160 mg via INTRAVENOUS

## 2013-11-26 MED ORDER — NEOSTIGMINE METHYLSULFATE 10 MG/10ML IV SOLN
INTRAVENOUS | Status: AC
  Filled 2013-11-26: qty 1

## 2013-11-26 MED ORDER — FLUTICASONE PROPIONATE 50 MCG/ACT NA SUSP: 2.0000 | Freq: Every day | NASAL | Status: DC | PRN

## 2013-11-26 MED ORDER — FENTANYL CITRATE 0.05 MG/ML IJ SOLN
INTRAMUSCULAR | Status: DC | PRN
  Administered 2013-11-26 (×2): 50 ug via INTRAVENOUS
  Administered 2013-11-26: 100 ug via INTRAVENOUS
  Administered 2013-11-26: 50 ug via INTRAVENOUS
  Administered 2013-11-26: 100 ug via INTRAVENOUS

## 2013-11-26 MED ORDER — DEXAMETHASONE SODIUM PHOSPHATE 10 MG/ML IJ SOLN
INTRAMUSCULAR | Status: AC
  Filled 2013-11-26: qty 1

## 2013-11-26 MED ORDER — GLYCOPYRROLATE 0.2 MG/ML IJ SOLN
INTRAMUSCULAR | Status: DC | PRN
  Administered 2013-11-26: 0.6 mg via INTRAVENOUS

## 2013-11-26 MED ORDER — DOCUSATE SODIUM 100 MG PO CAPS
100.0000 mg | ORAL_CAPSULE | Freq: Two times a day (BID) | ORAL | Status: DC
  Administered 2013-11-26 – 2013-11-28 (×4): 100 mg via ORAL
  Filled 2013-11-26 (×4): qty 1

## 2013-11-26 MED ORDER — FLUTICASONE FUROATE 27.5 MCG/SPRAY NA SUSP: 2.0000 | Freq: Every day | NASAL | Status: DC | PRN

## 2013-11-26 SURGICAL SUPPLY — 57 items
BARRIER ADHS 3X4 INTERCEED (GAUZE/BANDAGES/DRESSINGS) ×3 IMPLANT
BENZOIN TINCTURE PRP APPL 2/3 (GAUZE/BANDAGES/DRESSINGS) ×3 IMPLANT
BLADE SURG 10 STRL SS (BLADE) ×3 IMPLANT
CABLE HIGH FREQUENCY MONO STRZ (ELECTRODE) IMPLANT
CATH ROBINSON RED A/P 16FR (CATHETERS) IMPLANT
CATH URET WHISTLE 5FR 28IN (CATHETERS) ×3 IMPLANT
CHLORAPREP W/TINT 26ML (MISCELLANEOUS) ×3 IMPLANT
CLOSURE WOUND 1/2 X4 (GAUZE/BANDAGES/DRESSINGS) ×1
CLOSURE WOUND 1/4 X3 (GAUZE/BANDAGES/DRESSINGS)
CLOTH BEACON ORANGE TIMEOUT ST (SAFETY) ×3 IMPLANT
CONT PATH 16OZ SNAP LID 3702 (MISCELLANEOUS) ×3 IMPLANT
COVER MAYO STAND STRL (DRAPES) ×3 IMPLANT
COVER TABLE BACK 60X90 (DRAPES) ×3 IMPLANT
DECANTER SPIKE VIAL GLASS SM (MISCELLANEOUS) IMPLANT
DERMABOND ADVANCED (GAUZE/BANDAGES/DRESSINGS) ×2
DERMABOND ADVANCED .7 DNX12 (GAUZE/BANDAGES/DRESSINGS) ×1 IMPLANT
DRSG OPSITE POSTOP 4X10 (GAUZE/BANDAGES/DRESSINGS) ×3 IMPLANT
ELECT REM PT RETURN 9FT ADLT (ELECTROSURGICAL)
ELECTRODE REM PT RTRN 9FT ADLT (ELECTROSURGICAL) IMPLANT
FILTER SMOKE EVAC LAPAROSHD (FILTER) IMPLANT
GLOVE BIO SURGEON STRL SZ 6.5 (GLOVE) ×6 IMPLANT
GLOVE BIO SURGEON STRL SZ8 (GLOVE) ×6 IMPLANT
GLOVE BIO SURGEONS STRL SZ 6.5 (GLOVE) ×3
GLOVE BIOGEL PI IND STRL 6.5 (GLOVE) ×1 IMPLANT
GLOVE BIOGEL PI IND STRL 7.0 (GLOVE) ×3 IMPLANT
GLOVE BIOGEL PI INDICATOR 6.5 (GLOVE) ×2
GLOVE BIOGEL PI INDICATOR 7.0 (GLOVE) ×6
GLOVE ECLIPSE 6.5 STRL STRAW (GLOVE) ×6 IMPLANT
GLOVE NEODERM STER SZ 7 (GLOVE) ×3 IMPLANT
GOWN STRL REUS W/TWL LRG LVL3 (GOWN DISPOSABLE) ×12 IMPLANT
NEEDLE INSUFFLATION 120MM (ENDOMECHANICALS) ×3 IMPLANT
NS IRRIG 1000ML POUR BTL (IV SOLUTION) ×3 IMPLANT
PACK LAVH (CUSTOM PROCEDURE TRAY) ×3 IMPLANT
PROTECTOR NERVE ULNAR (MISCELLANEOUS) ×3 IMPLANT
RETRACTOR WND ALEXIS 25 LRG (MISCELLANEOUS) ×1 IMPLANT
RTRCTR WOUND ALEXIS 25CM LRG (MISCELLANEOUS) ×3
SET IRRIG TUBING LAPAROSCOPIC (IRRIGATION / IRRIGATOR) ×3 IMPLANT
SHEARS HARMONIC ACE PLUS 36CM (ENDOMECHANICALS) IMPLANT
SPONGE LAP 18X18 X RAY DECT (DISPOSABLE) ×9 IMPLANT
STRIP CLOSURE SKIN 1/2X4 (GAUZE/BANDAGES/DRESSINGS) ×2 IMPLANT
STRIP CLOSURE SKIN 1/4X3 (GAUZE/BANDAGES/DRESSINGS) IMPLANT
SUT SILK 0 FSL (SUTURE) ×3 IMPLANT
SUT VIC AB 0 CT1 18XCR BRD8 (SUTURE) ×2 IMPLANT
SUT VIC AB 0 CT1 36 (SUTURE) ×6 IMPLANT
SUT VIC AB 0 CT1 8-18 (SUTURE) ×4
SUT VIC AB 2-0 CT1 (SUTURE) ×3 IMPLANT
SUT VIC AB 4-0 KS 27 (SUTURE) ×3 IMPLANT
SUT VICRYL 0 TIES 12 18 (SUTURE) IMPLANT
SUT VICRYL 0 UR6 27IN ABS (SUTURE) ×6 IMPLANT
SUT VICRYL 4-0 PS2 18IN ABS (SUTURE) ×3 IMPLANT
TOWEL OR 17X24 6PK STRL BLUE (TOWEL DISPOSABLE) ×6 IMPLANT
TRAY FOLEY CATH 14FR (SET/KITS/TRAYS/PACK) ×3 IMPLANT
TROCAR HASSON GELL 12X100 (TROCAR) ×3 IMPLANT
TROCAR XCEL NON-BLD 5MMX100MML (ENDOMECHANICALS) ×3 IMPLANT
TROCAR XCEL OPT SLVE 5M 100M (ENDOMECHANICALS) ×6 IMPLANT
WARMER LAPAROSCOPE (MISCELLANEOUS) ×3 IMPLANT
WATER STERILE IRR 1000ML POUR (IV SOLUTION) IMPLANT

## 2013-11-26 NOTE — Addendum Note (Signed)
Addendum created 11/26/13 1224 by Dana AllanAmy Roshaun Pound, MD   Modules edited: Orders

## 2013-11-26 NOTE — Transfer of Care (Signed)
Immediate Anesthesia Transfer of Care Note  Patient: Renee RegalCarol Spillane  Procedure(s) Performed: Procedure(s) with comments: TOTAL ABDOMINAL HYSTERECTOMY/BILATERAL SALPINGO-OOPHORECTOMY/CYSTOSCOPY WITH STENT PLACEMENT/EXTENSIVE LYSIS OF ADHESIONS (N/A) - 2 1/2hrs OR time  Patient Location: PACU  Anesthesia Type:General  Level of Consciousness: awake  Airway & Oxygen Therapy: Patient Spontanous Breathing  Post-op Assessment: Report given to PACU RN  Post vital signs: stable  Filed Vitals:   11/26/13 0606  BP: 138/86  Pulse: 101  Temp: 36.7 C  Resp: 18    Complications: No apparent anesthesia complications

## 2013-11-26 NOTE — Anesthesia Postprocedure Evaluation (Signed)
  Anesthesia Post-op Note  Anesthesia Post Note  Patient: Renee Chambers  Procedure(s) Performed: Procedure(s) (LRB): TOTAL ABDOMINAL HYSTERECTOMY/BILATERAL SALPINGO-OOPHORECTOMY/CYSTOSCOPY WITH STENT PLACEMENT/EXTENSIVE LYSIS OF ADHESIONS (N/A)  Anesthesia type: General  Patient location: PACU  Post pain: Pain level controlled  Post assessment: Post-op Vital signs reviewed  Last Vitals:  Filed Vitals:   11/26/13 1130  BP: 131/65  Pulse: 85  Temp:   Resp: 16    Post vital signs: Reviewed  Level of consciousness: sedated  Complications: No apparent anesthesia complications

## 2013-11-26 NOTE — Op Note (Signed)
Operative note  Preoperative diagnosis Bilateral ovarian masses, suspected endometriomas Pelvic pain Dysmenorrhea Menorrhagia  Postoperative diagnosis Extensive pelvic endometriosis and adhesions with bilateral endometriomas  Procedure Diagnostic laparoscopy Laparotomy with abdominal hysterectomy and bilateral salpingo-oophorectomy with extensive lysis of adhesions Cystoscopy with stent placement to left ureter for dissection  Surgeon Dr. Huel CoteKathy Samariya Rockhold Dr. Tawanna Coolerodd Meisinger  Anesthesia Gen.  Fluids Urine output approximately 200 cc clear urine IV fluids 2200 cc LR Estimated blood loss 600 cc  Findings On diagnostic laparoscopy the uterus was normal in size. The ovaries were abnormal with bilateral  endometriomas and only the left ovary was visible the right ovary was tethered down into the cul-de-sac and to the right pelvic sidewall. The posterior cul-de-sac was obliterated with adhesions of the rectum and omentum to the posterior uterine surface. The endometriomas were adherent also to the sidewalls and the bowel posteriorly. The upper abdomen had some small mesenteric adhesions but otherwise was clear.  Specimen Uterus cervix and adnexa with endometriotic cysts sent to pathology.  Procedure Patient was taken to the operating room where general anesthesia was obtained without difficulty. She was then prepped and draped in the dorsal lithotomy position in the normal sterile fashion. An appropriate time out was performed. Attention was turned to the vagina where a speculum was placed in the cervix attempted to be cannulated with a Hulka tenaculum. However the cervix was scored very anteriorly into the right and adequate placement could not be performed. A Foley catheter was placed. Attention was then turned to the patient's abdomen where a small infraumbilical incision was made with the scalpel. This was carried down to the level of the fascia which was opened sharply. The  peritoneum was then entered bluntly and mesenteric adhesions noted to the upper right abdominal wall. These were moved away from the area bluntly and the [facility] trocar placed within the incision after a pursestring suture of 0 Vicryl was placed on the fascia. With the [facility] in [facility] the camera was introduced and the abdomen and pelvis carefully inspected with findings as previously stated. An additional port was placed in the right upper quadrant under direct visualization 5 mm in size. All port sites were injected with quarter percent Marcaine prior placement. With this port in place a blunt probe was utilized to try to find a plane between the endometriomas and the bowel and sidewall. All was found to be densely adherent with no identifiable plane.  The left ovary was markedly enlarged with an endometrioma the right ovary was not visible but was tethered behind the uterus and underneath the bowel and rectum adhesions. At this point the decision was made to proceed with laparotomy. A Pfannenstiel skin incision was made through pre-existing scar and carried through to underlying layer of fascia by sharp dissection and Bovie cautery. Fascia was nicked in midline and extended laterally with Mayo scissors. The superior aspect was grasped with Coker clamps elevated and attempt made to dissect this off the rectus muscles it was somewhat adherent in this area. In a similar fashion the inferior aspect was dissected off the rectus muscles. Rectus muscles were separated in the midline and the peritoneal cavity entered bluntly. Peritoneal incision was then extended both superiorly and inferiorly with careful attention to avoid both bowel and bladder.  The Alexis self-retaining retractor was placed within the incision and the bowel attempted to be packed away with moist laparotomy sponges. The first area of concern was the dense adhesions of the rectum and bowel to the endometriomas  and posterior uterus surface.  With  meticulous dissection both sharply and bluntly the rectum was mobilized off of the endometriomas and away from the posterior uterus. The endometriomas did opened and exude a chocolate-like liquid during this process. Once this was accomplished as far as could be the endometriomas were still densely adherent to the sidewalls and the uterus itself therefore attention was turned to the round ligaments which were taken down with Bovie cautery bilaterally and the retroperitoneal space opened. The retroperitoneal area was dissected and the infundibulopelvic ligaments identified bilaterally. There was no normal ovarian tissue identified on either ovary therefore the decision was made to take down the infundibulopelvic ligaments bilaterally. These were transected and suture ligated with 0 Vicryl. The uterus was then grasped at each cornua with a Kelly clamp and a tedious and meticulous dissection of the endometriomas was begun along the pelvic sidewall. The ureter was identified on the right and we were able to mobilize the right endometrioma tissue away from the pelvic sidewall and the bowel. Once this was performed attention was turned to the left and a dissection begun on that side as well. The ureter could not clearly be identified.  Therefore the endometrioma was resected away from the uterus itself so that the hysterectomy could be completed to improve visualization. The bladder flap was created and dissected away from the anterior surface of the cervix the Zeppelin clamps were utilized bilaterally to work their way down the paracervical tissue down to the level of the external cervical os. Each step was transected and suture ligated with 0 Vicryl. Finally the cuff was entered and the uterus completely amputated from the vaginal cuff. The vaginal cuff was then closed with 0 Vicryl in figure-of-eight sutures.  With the uterus now removed and visualization improved the ureter still could not be clearly identified.   At  this point Dr. Jackelyn Knife performed a cystoscopy and placed a 5 mm French stent into the left ureter for identification. On cystoscopy the bladder appeared normal with no defects and the catheter fed normally. Abdominally this could be palpated and the ureter was then clearly identified on the left. Of note the ureter did have a thin wall however there were no defects noted. With the stent in place the remainder of the left endometrioma was dissected away from the pelvic sidewall and handed off to pathology. It is possible there was a slight amount of residual cyst wall left behind but the majority of the tissue was removed. The pelvis was then copiously irrigated and any areas of bleeding were controlled with Bovie cautery or a suture ligature of 0 Vicryl. When no active bleeding was noted and only mild oozing in the raw surfaces of the posterior cul-de-sac an Interceed was obtained and placed in that area. All pedicles were observed and found to be hemostatic the upper abdomen was inspected and also found to be hemostatic. All instruments and sponges were then removed from the abdomen. The port sites were closed with the pursestring suture at the umbilical port being tied down and a good closure palpated. The skin at both sites was closed with 3-0 Vicryl in a subcuticular stitch and Dermabond. The peritoneum and rectus muscles were then closed with 2-0 Vicryl in interrupted mattress sutures. The fascia was then closed with 0 Vicryl in a running fashion. Along the right aspect of the fascial closure it was noted that the fascia was then and was pulling through with a small defect created. This area was reinforced  with additional figure-of-eight sutures of 0 Vicryl with a good closure noted. The subcuticular tissue was then closed with 3-0 plain in a running fashion.  Finally the skin was closed with 4-0 Vicryl in a subcuticular stitch on a Keith needle and reinforced with benzoin and Steri-Strips. The ureteral stent  was then removed by Dr. Jackelyn KnifeMeisinger and Foley catheter left in place. All instruments and sponge counts were correct and the patient was taken to the recovery room in good condition.

## 2013-11-26 NOTE — Progress Notes (Signed)
Patient ID: Kandy GarrisonCarol Chambers, female   DOB: 08/14/1965, 48 y.o.   MRN: [id number] Per pt no changes in dictated H&P/  Was able to do the bowel prep ok.  Answered questions for her.  Ready to proceed.

## 2013-11-26 NOTE — Anesthesia Preprocedure Evaluation (Signed)
Anesthesia Evaluation  Patient identified by MRN, date of birth, ID band Patient awake    Reviewed: Allergy & Precautions, H&P , Patient's Chart, lab work & pertinent test results, reviewed documented beta blocker date and time   History of Anesthesia Complications Negative for: history of anesthetic complications  Airway Mallampati: II TM Distance: >3 FB Neck ROM: full    Dental   Pulmonary  breath sounds clear to auscultation        Cardiovascular Exercise Tolerance: Good Rhythm:regular Rate:Normal     Neuro/Psych PSYCHIATRIC DISORDERS    GI/Hepatic   Endo/Other    Renal/GU      Musculoskeletal   Abdominal   Peds  Hematology  (+) anemia ,   Anesthesia Other Findings   Reproductive/Obstetrics                           Anesthesia Physical Anesthesia Plan  ASA: II  Anesthesia Plan: General ETT   Post-op Pain Management:    Induction:   Airway Management Planned:   Additional Equipment:   Intra-op Plan:   Post-operative Plan:   Informed Consent: I have reviewed the patients History and Physical, chart, labs and discussed the procedure including the risks, benefits and alternatives for the proposed anesthesia with the patient or authorized representative who has indicated his/her understanding and acceptance.   Dental Advisory Given  Plan Discussed with: CRNA and Surgeon  Anesthesia Plan Comments:         Anesthesia Quick Evaluation

## 2013-11-27 ENCOUNTER — Encounter (HOSPITAL_COMMUNITY): Payer: Self-pay | Admitting: Obstetrics and Gynecology

## 2013-11-27 LAB — CBC
HCT: 22.6 % — ABNORMAL LOW (ref 36.0–46.0)
HEMOGLOBIN: 7.1 g/dL — AB (ref 12.0–15.0)
MCH: 26.1 pg (ref 26.0–34.0)
MCHC: 31.4 g/dL (ref 30.0–36.0)
MCV: 83.1 fL (ref 78.0–100.0)
Platelets: 214 10*3/uL (ref 150–400)
RBC: 2.72 MIL/uL — AB (ref 3.87–5.11)
RDW: 13.7 % (ref 11.5–15.5)
WBC: 8.7 10*3/uL (ref 4.0–10.5)

## 2013-11-27 LAB — BASIC METABOLIC PANEL
BUN: 5 mg/dL — ABNORMAL LOW (ref 6–23)
CO2: 31 meq/L (ref 19–32)
Calcium: 8.2 mg/dL — ABNORMAL LOW (ref 8.4–10.5)
Chloride: 104 mEq/L (ref 96–112)
Creatinine, Ser: 0.5 mg/dL (ref 0.50–1.10)
GFR calc Af Amer: >90 mL/min (ref >90)
GFR calc non Af Amer: >90 mL/min (ref >90)
GLUCOSE: 107 mg/dL — AB (ref 70–99)
Potassium: 3.6 mEq/L — ABNORMAL LOW (ref 3.7–5.3)
Sodium: 142 mEq/L (ref 137–147)

## 2013-11-27 NOTE — Discharge Summary (Signed)
Physician Discharge Summary  Patient ID: Renee GarrisonCarol Molloy MRN: [id number] DOB/AGE: 47/03/1966 47 y.o.  Admit date: 11/26/2013 Discharge date: 11/28/2013  Admission Diagnoses:  Pelvic pain                                           Bilateral ovarian masses                                          Dysmenorrhea  Discharge Diagnoses: Stage IV endometriosis with bilateral endometriomas and extensive pelvic adhesions Active Problems:   S/P TAH-BSO   Discharged Condition: good  Hospital Course: Pt admitted for routine postoperative care.  She did well,and was ambulating, tolerating po, and pain controlled with po meds.  She voided without problem.  Hgb went from 11+ presurgically to 7.1 on post-operative day #1.  On postoperative day #2 she was ambulating, tolerating a regular diet, voiding and having small BM's.  Her anemia was asymptomatic, so she was just going to begin iron to help recover her counts.  Consults: None  Significant Diagnostic Studies: labs: CBC , pathology  Treatments: surgery: TAH/BSO/LOA  Discharge Exam: Blood pressure 123/65, pulse 99, temperature 98.6 F (37 C), temperature source Oral, resp. rate 18, height 5\' 4"  (1.626 m), weight 68.04 kg (150 lb), SpO2 94.00%. General appearance: alert and cooperative GI: soft NT Incision/Wound: C/D/I  Disposition: 01-Home or Self Care      Discharge Instructions   Diet - low sodium heart healthy    Complete by:  As directed      Discharge instructions    Complete by:  As directed   Avoid driving for at least 1-2 weeks or until off narcotic pain meds.  No heavy lifting greater than 10 lbs.  Nothing in vagina for 6 weeks.  May remove bandage in 1-2 days.  Shower over incision and pat dry. Take iron over the counter 1-2 times dialy (Slow Fe is a time-release brand)     Increase activity slowly    Complete by:  As directed             Medication List    STOP taking these medications       cyclobenzaprine 10 MG tablet   Commonly known as:  FLEXERIL     Naproxen Sodium 220 MG Caps      TAKE these medications       estradiol 1 MG tablet  Commonly known as:  ESTRACE  Take 1 tablet (1 mg total) by mouth daily.     fexofenadine 180 MG tablet  Commonly known as:  ALLEGRA  Take 180 mg by mouth daily.     fluticasone 27.5 MCG/SPRAY nasal spray  Commonly known as:  VERAMYST  Place 2 sprays into the nose daily as needed for rhinitis or allergies.     ibuprofen 600 MG tablet  Commonly known as:  ADVIL,MOTRIN  Take 1 tablet (600 mg total) by mouth every 6 (six) hours as needed (mild pain).     montelukast 10 MG tablet  Commonly known as:  SINGULAIR  Take 10 mg by mouth at bedtime.     ONE-A-DAY VITACRAVES Chew  Chew 2 tablets by mouth daily.     oxyCODONE-acetaminophen 5-325 MG per tablet  Commonly known as:  PERCOCET/ROXICET  Take 1-2 tablets by mouth every 4 (four) hours as needed for severe pain (moderate to severe pain (when tolerating fluids)).     sertraline 100 MG tablet  Commonly known as:  ZOLOFT  Take 100 mg by mouth at bedtime.       Follow-up Information   Follow up with Oliver PilaICHARDSON,Fynley Chrystal W, MD In 2 weeks. (incision check)    Specialty:  Obstetrics and Gynecology   Contact information:   [address] [place] [address] [phone]       Signed: Oliver PilaKathy W Aydian Dimmick 11/28/2013, 8:53 AM

## 2013-11-27 NOTE — Progress Notes (Signed)
1 Day Post-Op Procedure(s) (LRB): TOTAL ABDOMINAL HYSTERECTOMY/BILATERAL SALPINGO-OOPHORECTOMY/CYSTOSCOPY WITH STENT PLACEMENT/EXTENSIVE LYSIS OF ADHESIONS (N/A)  Subjective: Patient reports tolerating PO.  Feels like she needs to pass gas.  Pain well-controlled, Ambulating without problem.  Objective: I have reviewed patient's vital signs, intake and output and labs.  General: alert and cooperative GI: incision: clean, dry and intact and soft and NT Vaginal Bleeding: minimal  Assessment: s/p Procedure(s) with comments: TOTAL ABDOMINAL HYSTERECTOMY/BILATERAL SALPINGO-OOPHORECTOMY/CYSTOSCOPY WITH STENT PLACEMENT/EXTENSIVE LYSIS OF ADHESIONS (N/A) - 2 1/2hrs OR time: stable  Plan: Advance diet Advance to PO medication Discontinue IV fluids  LOS: 1 day    Oliver PilaKathy W Abel Hageman 11/27/2013, 8:52 AM

## 2013-11-28 LAB — CBC
HCT: 21.1 % — ABNORMAL LOW (ref 36.0–46.0)
HEMOGLOBIN: 6.8 g/dL — AB (ref 12.0–15.0)
MCH: 26.7 pg (ref 26.0–34.0)
MCHC: 32.2 g/dL (ref 30.0–36.0)
MCV: 82.7 fL (ref 78.0–100.0)
Platelets: 210 10*3/uL (ref 150–400)
RBC: 2.55 MIL/uL — ABNORMAL LOW (ref 3.87–5.11)
RDW: 13.8 % (ref 11.5–15.5)
WBC: 7.9 10*3/uL (ref 4.0–10.5)

## 2013-11-28 MED ORDER — IBUPROFEN 600 MG PO TABS: 600.0000 mg | ORAL_TABLET | Freq: Four times a day (QID) | ORAL | Status: DC | PRN

## 2013-11-28 MED ORDER — ESTRADIOL 1 MG PO TABS: 1.0000 mg | ORAL_TABLET | Freq: Every day | ORAL | Status: DC

## 2013-11-28 MED ORDER — OXYCODONE-ACETAMINOPHEN 5-325 MG PO TABS: 1.0000 | ORAL_TABLET | ORAL | Status: DC | PRN

## 2013-11-28 NOTE — Progress Notes (Signed)
Teaching complete  Ambulated out with mom

## 2013-11-28 NOTE — Progress Notes (Signed)
2 Days Post-Op Procedure(s) (LRB): TOTAL ABDOMINAL HYSTERECTOMY/BILATERAL SALPINGO-OOPHORECTOMY/CYSTOSCOPY WITH STENT PLACEMENT/EXTENSIVE LYSIS OF ADHESIONS (N/A)  Subjective: Patient reports tolerating PO, + flatus, + BM and no problems voiding.    Objective: I have reviewed patient's vital signs, intake and output and labs.  General: alert and cooperative GI: incision: clean, dry and intact and abdomen soft non distended  Assessment: s/p Procedure(s) with comments: TOTAL ABDOMINAL HYSTERECTOMY/BILATERAL SALPINGO-OOPHORECTOMY/CYSTOSCOPY WITH STENT PLACEMENT/EXTENSIVE LYSIS OF ADHESIONS (N/A) - 2 1/2hrs OR time: stable and progressing well  Plan: Discharge home  LOS: 2 days    Oliver PilaKathy W Drayton Tieu 11/28/2013, 8:47 AM

## 2014-03-03 ENCOUNTER — Other Ambulatory Visit: Payer: Self-pay | Admitting: Family Medicine

## 2014-03-03 DIAGNOSIS — M79621 Pain in right upper arm: Secondary | ICD-10-CM

## 2014-03-03 DIAGNOSIS — M79622 Pain in left upper arm: Secondary | ICD-10-CM

## 2014-03-04 ENCOUNTER — Other Ambulatory Visit: Payer: Self-pay | Admitting: Family Medicine

## 2014-03-04 DIAGNOSIS — M79621 Pain in right upper arm: Secondary | ICD-10-CM

## 2014-03-04 DIAGNOSIS — M79622 Pain in left upper arm: Secondary | ICD-10-CM

## 2014-03-04 DIAGNOSIS — N644 Mastodynia: Secondary | ICD-10-CM

## 2014-04-17 ENCOUNTER — Other Ambulatory Visit: Payer: BC Managed Care – HMO

## 2015-12-09 ENCOUNTER — Ambulatory Visit: Payer: Self-pay | Admitting: Allergy and Immunology

## 2016-01-05 ENCOUNTER — Ambulatory Visit (INDEPENDENT_AMBULATORY_CARE_PROVIDER_SITE_OTHER): Payer: BLUE CROSS/BLUE SHIELD | Admitting: Allergy and Immunology

## 2016-01-05 ENCOUNTER — Encounter: Payer: Self-pay | Admitting: Allergy and Immunology

## 2016-01-05 VITALS — BP 120/82 | HR 84 | Temp 97.7°F | Resp 16 | Ht 64.6 in | Wt 171.8 lb

## 2016-01-05 DIAGNOSIS — J302 Other seasonal allergic rhinitis: Secondary | ICD-10-CM | POA: Insufficient documentation

## 2016-01-05 DIAGNOSIS — H6983 Other specified disorders of Eustachian tube, bilateral: Secondary | ICD-10-CM | POA: Diagnosis not present

## 2016-01-05 DIAGNOSIS — R442 Other hallucinations: Secondary | ICD-10-CM

## 2016-01-05 DIAGNOSIS — H6980 Other specified disorders of Eustachian tube, unspecified ear: Secondary | ICD-10-CM | POA: Insufficient documentation

## 2016-01-05 DIAGNOSIS — J3089 Other allergic rhinitis: Secondary | ICD-10-CM | POA: Diagnosis not present

## 2016-01-05 MED ORDER — AZELASTINE-FLUTICASONE 137-50 MCG/ACT NA SUSP: NASAL | Status: DC

## 2016-01-05 MED ORDER — MONTELUKAST SODIUM 10 MG PO TABS: ORAL_TABLET | ORAL | Status: DC

## 2016-01-05 NOTE — Patient Instructions (Addendum)
Allergic rhinitis with a nonallergic component  Aeroallergen avoidance measures have been discussed and provided in written form. A prescription has been provided for montelukast 10 mg daily at bedtime.  A prescription has been provided for Dymista (azelastine/fluticasone) nasal spray, 1 spray per nostril twice daily as needed. Proper nasal spray technique has been discussed and demonstrated.  Nasal saline lavage (NeilMed) as needed has been recommended along with instructions for proper administration.  Guaifenesin 1200 mg (plus/minus pseudoephedrine 120 mg) twice daily as needed with adequate hydration. Pseudoephedrine is only to be used for short-term relief of nasal/sinus congestion. Long-term use is discouraged due to potential side effects.   If symptoms persist or progress, otolaryngology consultation may be warranted.  Eustachian tube dysfunction  Treatment plan as outlined above.  Phantosmia Most likely secondary to sinusitis.  If this problem recurs, particularly in the absence of sinusitis, we will pursue further evaluation.    Return in about 4 months (around 05/06/2016), or if symptoms worsen or fail to improve.

## 2016-01-05 NOTE — Assessment & Plan Note (Signed)
Most likely secondary to sinusitis.  If this problem recurs, particularly in the absence of sinusitis, we will pursue further evaluation. 

## 2016-01-05 NOTE — Assessment & Plan Note (Addendum)
   Aeroallergen avoidance measures have been discussed and provided in written form. A prescription has been provided for montelukast 10 mg daily at bedtime.  A prescription has been provided for Dymista (azelastine/fluticasone) nasal spray, 1 spray per nostril twice daily as needed. Proper nasal spray technique has been discussed and demonstrated.  Nasal saline lavage (NeilMed) as needed has been recommended along with instructions for proper administration.  Guaifenesin 1200 mg (plus/minus pseudoephedrine 120 mg) twice daily as needed with adequate hydration. Pseudoephedrine is only to be used for short-term relief of nasal/sinus congestion. Long-term use is discouraged due to potential side effects.   If symptoms persist or progress, otolaryngology consultation may be warranted.

## 2016-01-05 NOTE — Assessment & Plan Note (Signed)
   Treatment plan as outlined above. 

## 2016-01-05 NOTE — Progress Notes (Signed)
New Patient Note  RE: Renee Chambers MRN: [id number] DOB: 10/28/1965 Date of Office Visit: 01/05/2016  Referring provider: Juluis RainierBarnes, Elizabeth, MD Primary care provider: Willow OraANDY,CAMILLE L, MD  Chief Complaint: Nasal Congestion   History of present illness: HPI Comments: Renee Chambers is a 50 y.o. female presenting today for consultation of rhinosinusitis.  She reports that throughout her life she has experienced nasal allergy symptoms.  She was treated in the past with aeroallergen immunotherapy and her symptoms seem to improve, however over the past 3 years or allergy symptoms have progressed to the point that over the counter medications have provided inadequate control.  She reports that montelukast had been of benefit in the past, however her prescription ran out sometime over this past year.  Over the past 4 months she has experienced nasal congestion, sinus pressure, thick postnasal drainage, and bilateral ear pressure.  She has been on 3 rounds of antibiotics over the past 4 months for otitis media, sinusitis, and bronchitis.  Specific triggers for her nasal/sinus symptoms include pollen exposure, rapid weather changes, and strong aromas such as perfumes and potpourri.  She tends to develop bronchitis, typically in the fall, with symptoms consisting of coughing, chest congestion, and occasional wheezing. She reports having detected a foul "acidic" smell that no one else could detect. The scent was more prominent in her home than outdoors. This occurred for a couple months but resolved and has not recurred over the past month.    Assessment and plan: Allergic rhinitis with a nonallergic component  Aeroallergen avoidance measures have been discussed and provided in written form. A prescription has been provided for montelukast 10 mg daily at bedtime.  A prescription has been provided for Dymista (azelastine/fluticasone) nasal spray, 1 spray per nostril twice daily as needed. Proper  nasal spray technique has been discussed and demonstrated.  Nasal saline lavage (NeilMed) as needed has been recommended along with instructions for proper administration.  Guaifenesin 1200 mg (plus/minus pseudoephedrine 120 mg) twice daily as needed with adequate hydration. Pseudoephedrine is only to be used for short-term relief of nasal/sinus congestion. Long-term use is discouraged due to potential side effects.   If symptoms persist or progress, otolaryngology consultation may be warranted.  Eustachian tube dysfunction  Treatment plan as outlined above.  Phantosmia Most likely secondary to sinusitis.  If this problem recurs, particularly in the absence of sinusitis, we will pursue further evaluation.    Meds ordered this encounter  Medications  . montelukast (SINGULAIR) 10 MG tablet    Sig: TAKE ONE TABLET ONCE DAILY    Dispense:  30 tablet    Refill:  5  . Azelastine-Fluticasone (DYMISTA) 137-50 MCG/ACT SUSP    Sig: USE ONE SPRAY IN EACH NOSTRIL TWICE DAILY    Dispense:  1 Bottle    Refill:  5    Diagnositics: Epicutaneous testing: Positive to tree pollen. Intradermal testing: Positive to weed pollen, cockroach antigen, and dust mite antigen.    Physical examination: Blood pressure 120/82, pulse 84, temperature 97.7 F (36.5 C), temperature source Oral, resp. rate 16, height 5' 4.6" (1.641 m), weight 171 lb 12.8 oz (77.928 kg).  General: Alert, interactive, in no acute distress. HEENT: TM pearly gray, turbinates moderately edematous without discharge, post-pharynx erythematous. Neck: Supple without lymphadenopathy. Lungs: Clear to auscultation without wheezing, rhonchi or rales. CV: Normal S1, S2 without murmurs. Abdomen: Nondistended, nontender. Skin: Warm and dry, without lesions or rashes. Extremities:  No clubbing, cyanosis or edema. Neuro:   Grossly intact.  Review of systems:  Review of Systems  Constitutional: Negative for fever, chills and weight  loss.  HENT: Positive for congestion and ear pain. Negative for nosebleeds.   Eyes: Negative for blurred vision.  Respiratory: Negative for hemoptysis.   Cardiovascular: Negative for chest pain.  Gastrointestinal: Negative for diarrhea and constipation.  Genitourinary: Negative for dysuria.  Musculoskeletal: Negative for myalgias and joint pain.  Skin: Negative for itching and rash.  Neurological: Positive for headaches. Negative for dizziness.  Endo/Heme/Allergies: Positive for environmental allergies. Does not bruise/bleed easily.    Past medical history:  Past Medical History  Diagnosis Date  . Medical history non-contributory     Past surgical history:  Past Surgical History  Procedure Laterality Date  . Appendectomy    . Ovarian cyst removal    . Laparoscopic assisted vaginal hysterectomy N/A 11/26/2013    Procedure: TOTAL ABDOMINAL HYSTERECTOMY/BILATERAL SALPINGO-OOPHORECTOMY/CYSTOSCOPY WITH STENT PLACEMENT/EXTENSIVE LYSIS OF ADHESIONS;  Surgeon: Oliver PilaKathy W Richardson, MD;  Location: [facility];  Service: Gynecology;  Laterality: N/A;  2 1/2hrs OR time    Family history: Family History  Problem Relation Age of Onset  . Hypertension Father   . CAD Father   . Allergic rhinitis Brother   . Hypertension Brother   . Asthma Neg Hx     Social history: Social History   Social History  . Marital Status: Married    Spouse Name: N/A  . Number of Children: N/A  . Years of Education: N/A   Occupational History  . Not on file.   Social History Main Topics  . Smoking status: Never Smoker   . Smokeless tobacco: Not on file  . Alcohol Use: No  . Drug Use: No  . Sexual Activity: Yes    Birth Control/ Protection: None   Other Topics Concern  . Not on file   Social History Narrative   Environmental History: Patient lives in a 49 year old house with hardwood floors throughout and carpeting in the bedroom, gas heat, and central air.  There is a dog and a cat in the house which  does not have access to her bedroom.  She is a nonsmoker.    Medication List       This list is accurate as of: 01/05/16  5:31 PM.  Always use your most recent med list.               Azelastine-Fluticasone 137-50 MCG/ACT Susp  Commonly known as:  DYMISTA  USE ONE SPRAY IN EACH NOSTRIL TWICE DAILY     cetirizine 10 MG tablet  Commonly known as:  ZYRTEC  Take 10 mg by mouth daily.     CYCLOBENZAPRINE HCL PO  Take by mouth as needed.     estradiol 1 MG tablet  Commonly known as:  ESTRACE  Take 1 tablet (1 mg total) by mouth daily.     fluticasone 50 MCG/ACT nasal spray  Commonly known as:  FLONASE  Place 1 spray into both nostrils daily.     ibuprofen 600 MG tablet  Commonly known as:  ADVIL,MOTRIN  Take 1 tablet (600 mg total) by mouth every 6 (six) hours as needed (mild pain).     montelukast 10 MG tablet  Commonly known as:  SINGULAIR  TAKE ONE TABLET ONCE DAILY     ONE-A-DAY VITACRAVES Chew  Chew 2 tablets by mouth daily.     sertraline 100 MG tablet  Commonly known as:  ZOLOFT  Take 100 mg by mouth at bedtime.  Known medication allergies: Allergies  Allergen Reactions  . Morphine And Related   . Morphine And Related Itching    I appreciate the opportunity to take part in Nimisha's care. Please do not hesitate to contact me with questions.  Sincerely,   R. Jorene Guest, MD

## 2016-01-05 NOTE — Assessment & Plan Note (Deleted)
Most likely secondary to sinusitis.  If this problem recurs, particularly in the absence of sinusitis, we will pursue further evaluation.

## 2016-01-06 ENCOUNTER — Other Ambulatory Visit: Payer: Self-pay

## 2016-01-06 ENCOUNTER — Telehealth: Payer: Self-pay | Admitting: *Deleted

## 2016-01-06 MED ORDER — AZELASTINE HCL 0.1 % NA SOLN: 2.0000 | Freq: Two times a day (BID) | NASAL | Status: DC

## 2016-01-06 MED ORDER — FLUTICASONE PROPIONATE 50 MCG/ACT NA SUSP: 2.0000 | NASAL | Status: DC | PRN

## 2016-01-06 NOTE — Telephone Encounter (Signed)
Informed pt of dr bobbits orders to do nasal saline, then azelastine then fluticasone in that order. Pt stated she understood.

## 2016-01-06 NOTE — Telephone Encounter (Signed)
Yes. Please do not a prescription for fluticasone nasal spray, 2 sprays per nostril daily as needed and azelastine, 2 sprays per nostril twice a day when necessary.  Please inform the patient of this and asked her to use nasal saline first, then azelastine, than fluticasone in that order.  Thanks.

## 2016-01-06 NOTE — Telephone Encounter (Signed)
Sent in fluticasone and azleastine.

## 2016-01-06 NOTE — Telephone Encounter (Signed)
Pa received for Dymista they would like to know if we could split medications azelatine and fluticasone. Dr Nunzio CobbsBobbitt please advise

## 2016-04-25 ENCOUNTER — Ambulatory Visit: Payer: BLUE CROSS/BLUE SHIELD | Admitting: Allergy and Immunology

## 2016-05-03 ENCOUNTER — Ambulatory Visit (INDEPENDENT_AMBULATORY_CARE_PROVIDER_SITE_OTHER): Payer: BLUE CROSS/BLUE SHIELD | Admitting: Allergy and Immunology

## 2016-05-03 ENCOUNTER — Encounter: Payer: Self-pay | Admitting: Allergy and Immunology

## 2016-05-03 VITALS — BP 120/74 | HR 74 | Temp 97.7°F | Resp 16

## 2016-05-03 DIAGNOSIS — R442 Other hallucinations: Secondary | ICD-10-CM

## 2016-05-03 DIAGNOSIS — J0100 Acute maxillary sinusitis, unspecified: Secondary | ICD-10-CM

## 2016-05-03 DIAGNOSIS — J3089 Other allergic rhinitis: Secondary | ICD-10-CM

## 2016-05-03 DIAGNOSIS — J019 Acute sinusitis, unspecified: Secondary | ICD-10-CM | POA: Insufficient documentation

## 2016-05-03 MED ORDER — PREDNISONE 1 MG PO TABS: 10.0000 mg | ORAL_TABLET | Freq: Every day | ORAL | Status: DC

## 2016-05-03 NOTE — Assessment & Plan Note (Signed)
   Continue appropriate allergen avoidance measures, montelukast 10 mg daily bedtime, and nasal saline lavage as needed, and Dymista as needed.

## 2016-05-03 NOTE — Patient Instructions (Addendum)
Acute sinusitis  Prednisone has been provided, 20 mg x 4 days, 10 mg x1 day, then stop.  For thick post nasal drainage, nasal congestion, and/or sinus pressure, add guaifenesin 1200 mg (Mucinex Maximum Strength) plus/minus pseudoephedrine 120 mg  twice daily as needed with adequate hydration as discussed. Pseudoephedrine is only to be used for short-term relief of nasal/sinus congestion. Long-term use is discouraged due to potential side effects.  Continue montelukast 10 mg daily bedtime, and nasal saline lavage as needed, and Dymista nasal spray as needed.  The patient has been asked to contact me if her symptoms persist, progress, or if she becomes febrile. Otherwise, she may return for follow up in 6 months.  Allergic rhinitis with a nonallergic component  Continue appropriate allergen avoidance measures, montelukast 10 mg daily bedtime, and nasal saline lavage as needed, and Dymista as needed.  Phantosmia This symptom seems to be related to sinusitis with this patient.  If phantosmia persists or progresses in the absence of sinusitis we will evaluate further.   Return in about 6 months (around 11/01/2016), or if symptoms worsen or fail to improve.

## 2016-05-03 NOTE — Assessment & Plan Note (Signed)
This symptom seems to be related to sinusitis with this patient.  If phantosmia persists or progresses in the absence of sinusitis we will evaluate further.

## 2016-05-03 NOTE — Assessment & Plan Note (Signed)
   Prednisone has been provided, 20 mg x 4 days, 10 mg x1 day, then stop.  For thick post nasal drainage, nasal congestion, and/or sinus pressure, add guaifenesin 1200 mg (Mucinex Maximum Strength) plus/minus pseudoephedrine 120 mg  twice daily as needed with adequate hydration as discussed. Pseudoephedrine is only to be used for short-term relief of nasal/sinus congestion. Long-term use is discouraged due to potential side effects.  Continue montelukast 10 mg daily bedtime, and nasal saline lavage as needed, and Dymista nasal spray as needed.  The patient has been asked to contact me if her symptoms persist, progress, or if she becomes febrile. Otherwise, she may return for follow up in 6 months.

## 2016-05-03 NOTE — Progress Notes (Signed)
Follow-up Note  RE: Renee Chambers MRN: [id number] DOB: 04-Jun-1966 Date of Office Visit: 05/03/2016  Primary care provider: Willow Ora, MD Referring provider: Juluis Rainier, MD  History of present illness: Renee Chambers is a 50 y.o. female with allergic rhinitis and history of eustachian tube dysfunction presenting today for follow up.  She was seen in this clinic most recently on 01/05/2016 for her initial evaluation.  She reports that in the interval since her previous visit she has enjoyed significant symptom relief, particularly with montelukast.  She reports that up until this past week she has not had thick postnasal drainage, ear pressure, nasal congestion, or sinus pressure.  She also reports that since her sinus symptoms are cleared up after her initial evaluation, the phantosmia resolved.  However, approximately one week ago, she began to experience increased sinus symptoms, including sinus pressure and a burning sensation of the sinuses.  In addition, along with the sinus symptoms she began to notice and ammonia-like scent that no one else around her could smell.  She denies fevers and chills.   Assessment and plan: Acute sinusitis  Prednisone has been provided, 20 mg x 4 days, 10 mg x1 day, then stop.  For thick post nasal drainage, nasal congestion, and/or sinus pressure, add guaifenesin 1200 mg (Mucinex Maximum Strength) plus/minus pseudoephedrine 120 mg  twice daily as needed with adequate hydration as discussed. Pseudoephedrine is only to be used for short-term relief of nasal/sinus congestion. Long-term use is discouraged due to potential side effects.  Continue montelukast 10 mg daily bedtime, and nasal saline lavage as needed, and Dymista nasal spray as needed.  The patient has been asked to contact me if her symptoms persist, progress, or if she becomes febrile. Otherwise, she may return for follow up in 6 months.  Allergic rhinitis with a nonallergic  component  Continue appropriate allergen avoidance measures, montelukast 10 mg daily bedtime, and nasal saline lavage as needed, and Dymista as needed.  Phantosmia This symptom seems to be related to sinusitis with this patient.  If phantosmia persists or progresses in the absence of sinusitis we will evaluate further.   Physical examination: Blood pressure 120/74, pulse 74, temperature 97.7 F (36.5 C), temperature source Oral, resp. rate 16.  General: Alert, interactive, in no acute distress. HEENT: TMs pearly gray, turbinates edematous with thick discharge, post-pharynx erythematous. Neck: Supple without lymphadenopathy. Lungs: Clear to auscultation without wheezing, rhonchi or rales. CV: Normal S1, S2 without murmurs. Skin: Warm and dry, without lesions or rashes.  The following portions of the patient's history were reviewed and updated as appropriate: allergies, current medications, past family history, past medical history, past social history, past surgical history and problem list.    Medication List       Accurate as of 05/03/16  1:14 PM. Always use your most recent med list.          azelastine 0.1 % nasal spray Commonly known as:  ASTELIN Place 2 sprays into both nostrils 2 (two) times daily. Use in each nostril as directed   Azelastine-Fluticasone 137-50 MCG/ACT Susp Commonly known as:  DYMISTA USE ONE SPRAY IN EACH NOSTRIL TWICE DAILY   cetirizine 10 MG tablet Commonly known as:  ZYRTEC Take 10 mg by mouth daily.   CYCLOBENZAPRINE HCL PO Take by mouth as needed.   estradiol 1 MG tablet Commonly known as:  ESTRACE Take 1 tablet (1 mg total) by mouth daily.   fluticasone 50 MCG/ACT nasal spray Commonly known as:  FLONASE Place 1 spray into both nostrils daily.   fluticasone 50 MCG/ACT nasal spray Commonly known as:  FLONASE Place 2 sprays into both nostrils as needed for allergies or rhinitis.   ibuprofen 600 MG tablet Commonly known as:   ADVIL,MOTRIN Take 1 tablet (600 mg total) by mouth every 6 (six) hours as needed (mild pain).   montelukast 10 MG tablet Commonly known as:  SINGULAIR TAKE ONE TABLET ONCE DAILY   ONE-A-DAY VITACRAVES Chew Chew 2 tablets by mouth daily.   sertraline 100 MG tablet Commonly known as:  ZOLOFT Take 100 mg by mouth at bedtime.       Allergies  Allergen Reactions  . Morphine And Related   . Morphine And Related Itching   Review of systems: Review of systems negative except as noted in HPI / PMHx or noted below: Constitutional: Negative.  HENT: Negative.   Eyes: Negative.  Respiratory: Negative.   Cardiovascular: Negative.  Gastrointestinal: Negative.  Genitourinary: Negative.  Musculoskeletal: Negative.  Neurological: Negative.  Endo/Heme/Allergies: Negative.  Cutaneous: Negative.  Past Medical History:  Diagnosis Date  . Medical history non-contributory     Family History  Problem Relation Age of Onset  . Hypertension Father   . CAD Father   . Allergic rhinitis Brother   . Hypertension Brother   . Asthma Neg Hx     Social History   Social History  . Marital status: Married    Spouse name: N/A  . Number of children: N/A  . Years of education: N/A   Occupational History  . Not on file.   Social History Main Topics  . Smoking status: Never Smoker  . Smokeless tobacco: Not on file  . Alcohol use No  . Drug use: No  . Sexual activity: Yes    Birth control/ protection: None   Other Topics Concern  . Not on file   Social History Narrative  . No narrative on file    I appreciate the opportunity to take part in Neosha's care. Please do not hesitate to contact me with questions.  Sincerely,   R. Jorene Guestarter Brantlee Hinde, MD

## 2016-06-26 ENCOUNTER — Other Ambulatory Visit: Payer: Self-pay | Admitting: Allergy and Immunology

## 2016-06-26 DIAGNOSIS — J3089 Other allergic rhinitis: Secondary | ICD-10-CM

## 2016-10-28 ENCOUNTER — Other Ambulatory Visit: Payer: Self-pay | Admitting: Allergy and Immunology

## 2016-10-28 DIAGNOSIS — J3089 Other allergic rhinitis: Secondary | ICD-10-CM

## 2016-11-01 ENCOUNTER — Ambulatory Visit: Payer: BLUE CROSS/BLUE SHIELD | Admitting: Allergy and Immunology

## 2016-11-07 ENCOUNTER — Ambulatory Visit: Payer: BLUE CROSS/BLUE SHIELD | Admitting: Allergy and Immunology

## 2016-11-14 ENCOUNTER — Ambulatory Visit: Payer: BLUE CROSS/BLUE SHIELD | Admitting: Allergy and Immunology

## 2016-11-21 ENCOUNTER — Ambulatory Visit: Payer: BLUE CROSS/BLUE SHIELD | Admitting: Allergy and Immunology

## 2016-12-12 ENCOUNTER — Ambulatory Visit: Payer: BLUE CROSS/BLUE SHIELD | Admitting: Allergy and Immunology

## 2016-12-20 ENCOUNTER — Encounter: Payer: Self-pay | Admitting: Allergy and Immunology

## 2016-12-20 ENCOUNTER — Ambulatory Visit (INDEPENDENT_AMBULATORY_CARE_PROVIDER_SITE_OTHER): Payer: BLUE CROSS/BLUE SHIELD | Admitting: Allergy and Immunology

## 2016-12-20 DIAGNOSIS — R442 Other hallucinations: Secondary | ICD-10-CM

## 2016-12-20 DIAGNOSIS — J3089 Other allergic rhinitis: Secondary | ICD-10-CM | POA: Diagnosis not present

## 2016-12-20 NOTE — Assessment & Plan Note (Signed)
Well-controlled on current treatment plan.  Continue appropriate allergen avoidance measures, montelukast 10 mg daily bedtime, and nasal saline lavage as needed, and Dymista as needed.

## 2016-12-20 NOTE — Assessment & Plan Note (Signed)
   We will refer to otolaryngology, Dr. Suszanne Connerseoh, for further evaluation.

## 2016-12-20 NOTE — Progress Notes (Signed)
    Follow-up Note  RE: Renee GarrisonCarol Goguen MRN: [id number] DOB: 08/20/1965 Date of Office Visit: 12/20/2016  Primary care provider: Willow OraAndy, Camille L, MD Referring provider: Willow OraAndy, Camille L, MD  History of present illness: Renee Chambers is a 51 y.o. female with allergic rhinitis presenting today for follow up.  She was last seen in this clinic in October 2017. She reports that, with the exception of rare ocular pruritus or mild sinus pressure, her allergy symptoms have been well-controlled in the interval since her previous visit.  She continues to complain of a persistent "very intense spicy sweet" aroma that no one else around her is able to detect.  She states that the aroma is an "always present underlying smell", though it seems to be more intense when she is in her home.  Apparently, this has been an issue over the past 12-18 months.  She denies unexpected weight loss, drenching night sweats, or recurrent fevers.   Assessment and plan: Phantosmia  We will refer to otolaryngology, Dr. Suszanne Connerseoh, for further evaluation.  Allergic rhinitis with a nonallergic component Well-controlled on current treatment plan.  Continue appropriate allergen avoidance measures, montelukast 10 mg daily bedtime, and nasal saline lavage as needed, and Dymista as needed.   Physical examination: Blood pressure 122/76, pulse 100, temperature 97.8 F (36.6 C), resp. rate 20, height 5\' 5"  (1.651 m), weight 175 lb 12.8 oz (79.7 kg), SpO2 96 %.  General: Alert, interactive, in no acute distress. HEENT: TMs pearly gray, turbinates mildly edematous with clear discharge, post-pharynx unremarkable. Neck: Supple without lymphadenopathy. Lungs: Clear to auscultation without wheezing, rhonchi or rales. CV: Normal S1, S2 without murmurs. Skin: Warm and dry, without lesions or rashes.  The following portions of the patient's history were reviewed and updated as appropriate: allergies, current medications, past family  history, past medical history, past social history, past surgical history and problem list.  Allergies as of 12/20/2016      Reactions   Morphine And Related    Morphine And Related Itching      Medication List       Accurate as of 12/20/16  2:00 PM. Always use your most recent med list.          azelastine 0.1 % nasal spray Commonly known as:  ASTELIN Place 2 sprays into both nostrils 2 (two) times daily. Use in each nostril as directed   cetirizine 10 MG tablet Commonly known as:  ZYRTEC Take 10 mg by mouth daily.   CYCLOBENZAPRINE HCL PO Take by mouth as needed.   estradiol 1 MG tablet Commonly known as:  ESTRACE Take 1 tablet (1 mg total) by mouth daily.   ibuprofen 600 MG tablet Commonly known as:  ADVIL,MOTRIN Take 1 tablet (600 mg total) by mouth every 6 (six) hours as needed (mild pain).   montelukast 10 MG tablet Commonly known as:  SINGULAIR TAKE ONE TABLET BY MOUTH DAILY   ONE-A-DAY VITACRAVES Chew Chew 2 tablets by mouth daily.   sertraline 100 MG tablet Commonly known as:  ZOLOFT Take 100 mg by mouth at bedtime.       Allergies  Allergen Reactions  . Morphine And Related   . Morphine And Related Itching    I appreciate the opportunity to take part in Pamla's care. Please do not hesitate to contact me with questions.  Sincerely,   R. Jorene Guestarter Adryana Mogensen, MD

## 2016-12-20 NOTE — Patient Instructions (Signed)
Phantosmia  We will refer to otolaryngology, Dr. Suszanne Connerseoh, for further evaluation.  Allergic rhinitis with a nonallergic component Well-controlled on current treatment plan.  Continue appropriate allergen avoidance measures, montelukast 10 mg daily bedtime, and nasal saline lavage as needed, and Dymista as needed.   Return in about 6 months (around 06/21/2017), or if symptoms worsen or fail to improve.

## 2016-12-21 ENCOUNTER — Telehealth: Payer: Self-pay

## 2016-12-21 NOTE — Telephone Encounter (Signed)
-----   Message from Cristal Fordalph Carter Bobbitt, MD sent at 12/20/2016  2:00 PM EDT ----- Please refer to otolaryngology, Dr. Suszanne Connerseoh, for evaluation of phantosmia.

## 2016-12-21 NOTE — Telephone Encounter (Signed)
Referral faxed to Dr Teoh Thanks.  

## 2016-12-21 NOTE — Telephone Encounter (Signed)
Noted. Thanks.

## 2017-01-26 ENCOUNTER — Other Ambulatory Visit: Payer: Self-pay | Admitting: Allergy and Immunology

## 2017-01-26 DIAGNOSIS — J3089 Other allergic rhinitis: Secondary | ICD-10-CM

## 2017-08-04 ENCOUNTER — Other Ambulatory Visit: Payer: Self-pay

## 2017-08-04 DIAGNOSIS — J3089 Other allergic rhinitis: Secondary | ICD-10-CM

## 2017-08-07 ENCOUNTER — Other Ambulatory Visit: Payer: Self-pay

## 2017-08-07 DIAGNOSIS — J3089 Other allergic rhinitis: Secondary | ICD-10-CM

## 2017-08-07 MED ORDER — MONTELUKAST SODIUM 10 MG PO TABS: 10.0000 mg | ORAL_TABLET | Freq: Every day | ORAL | 0 refills | Status: DC

## 2017-08-07 NOTE — Telephone Encounter (Signed)
Filled montelukast 10 mg one time only. Pt. Needs office visit. Sent to the [facility] [address] [place].

## 2017-09-14 ENCOUNTER — Other Ambulatory Visit: Payer: Self-pay | Admitting: Allergy and Immunology

## 2017-09-21 ENCOUNTER — Other Ambulatory Visit: Payer: Self-pay | Admitting: Allergy and Immunology

## 2017-10-02 ENCOUNTER — Other Ambulatory Visit: Payer: Self-pay | Admitting: Allergy and Immunology

## 2018-03-16 LAB — HM COLONOSCOPY

## 2018-04-04 ENCOUNTER — Other Ambulatory Visit: Payer: Self-pay | Admitting: Allergy and Immunology

## 2018-04-23 ENCOUNTER — Other Ambulatory Visit: Payer: Self-pay | Admitting: Allergy and Immunology

## 2018-05-02 ENCOUNTER — Encounter: Payer: Self-pay | Admitting: Allergy

## 2018-05-02 ENCOUNTER — Ambulatory Visit: Payer: BLUE CROSS/BLUE SHIELD | Admitting: Allergy

## 2018-05-02 VITALS — BP 100/64 | HR 97 | Temp 97.8°F | Resp 16 | Ht <= 58 in | Wt 179.4 lb

## 2018-05-02 DIAGNOSIS — J3089 Other allergic rhinitis: Secondary | ICD-10-CM

## 2018-05-02 DIAGNOSIS — R442 Other hallucinations: Secondary | ICD-10-CM | POA: Diagnosis not present

## 2018-05-02 MED ORDER — MONTELUKAST SODIUM 10 MG PO TABS: 10.0000 mg | ORAL_TABLET | Freq: Every day | ORAL | 5 refills | Status: DC

## 2018-05-02 NOTE — Patient Instructions (Addendum)
Allergic rhinitis with a nonallergic component 2017 skin testing was positive to trees, weeds, cockroach and dust mites.  Well-controlled on current treatment plan. Noticed worsening symptoms since off Singulair. Did not need to use nasal sprays.   Continue appropriate allergen avoidance measures.  Continue montelukast 10 mg daily at bedtime.  Phantosmia Did not see ENT yet as her symptoms resolved over the past 2 months.  Discussed with patient if they return that she should see ENT for further evaluation.   Return in about 6 months (around 11/01/2018).

## 2018-05-02 NOTE — Assessment & Plan Note (Signed)
Did not see ENT yet as her symptoms resolved over the past 2 months.  Discussed with patient if they return that she should see ENT for further evaluation.

## 2018-05-02 NOTE — Progress Notes (Signed)
Follow Up Note  RE: Renee Chambers MRN: [id number] DOB: Dec 18, 1965 Date of Office Visit: 05/02/2018  Referring provider: No ref. provider found Primary care provider: Macy Mis, MD  Chief Complaint: Nasal Congestion  History of Present Illness: I had the pleasure of seeing Renee Chambers for a follow up visit at the [facility] on 05/02/2018. She is a 52 y.o. female, who is being followed for rhinitis. Today she is here for regular follow up visit. Her previous allergy office visit was on 12/20/2016 with Dr. Nunzio Cobbs.   Rhinitis: Currently on Singulair daily with good benefit. She ran out about 1 month ago and noticing some PND and chest congestion. Did not have to use Astelin nasal spray.  Usually gets bronchitis in the fall and she didn't have an infection last fall or this fall to date.  Patient did not go to ENT as her phantosmia improved the last 2 months.   Assessment and Plan: Renee Chambers is a 52 y.o. female with: Allergic rhinitis with a nonallergic component 2017 skin testing was positive to trees, weeds, cockroach and dust mites.  Well-controlled on current treatment plan. Noticed worsening symptoms since off Singulair. Did not need to use nasal sprays.   Continue appropriate allergen avoidance measures.  Continue montelukast 10 mg daily at bedtime.  Phantosmia Did not see ENT yet as her symptoms resolved over the past 2 months.  Discussed with patient if they return that she should see ENT for further evaluation.   Return in about 6 months (around 11/01/2018).  Meds ordered this encounter  Medications  . montelukast (SINGULAIR) 10 MG tablet    Sig: Take 1 tablet (10 mg total) by mouth at bedtime.    Dispense:  30 tablet    Refill:  5   Diagnostics: None  Medication List:  Current Outpatient Medications  Medication Sig Dispense Refill  . busPIRone (BUSPAR) 10 MG tablet Take 10 mg by mouth daily.    Marland Kitchen estradiol (ESTRACE) 1 MG tablet  Take 1 tablet (1 mg total) by mouth daily. 30 tablet 11  . ibuprofen (ADVIL,MOTRIN) 600 MG tablet Take 1 tablet (600 mg total) by mouth every 6 (six) hours as needed (mild pain). 30 tablet 0  . montelukast (SINGULAIR) 10 MG tablet Take 1 tablet (10 mg total) by mouth at bedtime. 30 tablet 5  . azelastine (ASTELIN) 0.1 % nasal spray Place 2 sprays into both nostrils 2 (two) times daily. Use in each nostril as directed (Patient not taking: Reported on 05/02/2018) 30 mL 5  . CYCLOBENZAPRINE HCL PO Take by mouth as needed.    . Multiple Vitamins-Minerals (ONE-A-DAY VITACRAVES) CHEW Chew 2 tablets by mouth daily.    . sertraline (ZOLOFT) 100 MG tablet Take 100 mg by mouth at bedtime.     Current Facility-Administered Medications  Medication Dose Route Frequency Provider Last Rate Last Dose  . predniSONE (DELTASONE) tablet 10 mg  10 mg Oral Q breakfast Bobbitt, Heywood Iles, MD       Allergies: Allergies  Allergen Reactions  . Morphine And Related   . Morphine And Related Itching   I reviewed her past medical history, social history, family history, and environmental history and no significant changes have been reported from previous visit on 12/20/2016.  Review of Systems  Constitutional: Negative for appetite change, chills, fever and unexpected weight change.  HENT: Negative for congestion and rhinorrhea.   Eyes: Negative for itching.  Respiratory: Negative for cough, chest tightness, shortness  of breath and wheezing.   Gastrointestinal: Negative for abdominal pain.  Skin: Negative for rash.  Neurological: Negative for headaches.   Objective: BP 100/64 (BP Location: Right Arm, Patient Position: Sitting, Cuff Size: Normal)   Pulse 97   Temp 97.8 F (36.6 C) (Oral)   Resp 16   Ht 4\' 6"  (1.372 m)   Wt 179 lb 6.4 oz (81.4 kg)   SpO2 96%   BMI 43.26 kg/m  Body mass index is 43.26 kg/m. Physical Exam  Constitutional: She is oriented to person, place, and time. She appears  well-developed and well-nourished.  HENT:  Head: Normocephalic and atraumatic.  Right Ear: External ear normal.  Left Ear: External ear normal.  Nose: Nose normal.  Mouth/Throat: Oropharynx is clear and moist.  Eyes: Conjunctivae and EOM are normal.  Neck: Neck supple.  Cardiovascular: Normal rate, regular rhythm and normal heart sounds. Exam reveals no gallop and no friction rub.  No murmur heard. Pulmonary/Chest: Effort normal and breath sounds normal. She has no wheezes. She has no rales.  Neurological: She is alert and oriented to person, place, and time.  Skin: Skin is warm. No rash noted.  Psychiatric: She has a normal mood and affect. Her behavior is normal.  Nursing note and vitals reviewed.  Previous notes and tests were reviewed. The plan was reviewed with the patient/family, and all questions/concerned were addressed.  It was my pleasure to see Renee Chambers today and participate in her care. Please feel free to contact me with any questions or concerns.  Sincerely,  Wyline Mood, DO Allergy & Immunology  [facility] office: [phone] [facility] [phone]

## 2018-05-02 NOTE — Assessment & Plan Note (Addendum)
2017 skin testing was positive to trees, weeds, cockroach and dust mites.  Well-controlled on current treatment plan. Noticed worsening symptoms since off Singulair. Did not need to use nasal sprays.   Continue appropriate allergen avoidance measures.  Continue montelukast 10 mg daily at bedtime.

## 2018-10-29 ENCOUNTER — Other Ambulatory Visit: Payer: Self-pay | Admitting: Allergy

## 2018-10-29 ENCOUNTER — Ambulatory Visit: Payer: BLUE CROSS/BLUE SHIELD | Admitting: Allergy and Immunology

## 2018-10-30 ENCOUNTER — Other Ambulatory Visit: Payer: Self-pay

## 2018-10-30 ENCOUNTER — Ambulatory Visit (INDEPENDENT_AMBULATORY_CARE_PROVIDER_SITE_OTHER): Payer: BLUE CROSS/BLUE SHIELD | Admitting: Allergy and Immunology

## 2018-10-30 ENCOUNTER — Encounter: Payer: Self-pay | Admitting: Allergy and Immunology

## 2018-10-30 DIAGNOSIS — J3089 Other allergic rhinitis: Secondary | ICD-10-CM

## 2018-10-30 DIAGNOSIS — H1010 Acute atopic conjunctivitis, unspecified eye: Secondary | ICD-10-CM | POA: Insufficient documentation

## 2018-10-30 DIAGNOSIS — Z8709 Personal history of other diseases of the respiratory system: Secondary | ICD-10-CM

## 2018-10-30 DIAGNOSIS — H1013 Acute atopic conjunctivitis, bilateral: Secondary | ICD-10-CM

## 2018-10-30 MED ORDER — MONTELUKAST SODIUM 10 MG PO TABS: 10.0000 mg | ORAL_TABLET | Freq: Every day | ORAL | 5 refills | Status: DC

## 2018-10-30 MED ORDER — OLOPATADINE HCL 0.7 % OP SOLN: 1.0000 [drp] | Freq: Every day | OPHTHALMIC | 5 refills | Status: DC | PRN

## 2018-10-30 MED ORDER — CARBINOXAMINE MALEATE 4 MG PO TABS: 4.0000 mg | ORAL_TABLET | Freq: Three times a day (TID) | ORAL | 5 refills | Status: DC | PRN

## 2018-10-30 MED ORDER — FLUTICASONE PROPIONATE 93 MCG/ACT NA EXHU: 2.0000 | INHALANT_SUSPENSION | Freq: Two times a day (BID) | NASAL | 5 refills | Status: DC

## 2018-10-30 NOTE — Assessment & Plan Note (Signed)
   Treatment plan as outlined above for allergic rhinitis.  A prescription has been provided for Pazeo, one drop per eye daily as needed.  I have also recommended eye lubricant drops (i.e., Natural Tears) as needed. 

## 2018-10-30 NOTE — Progress Notes (Signed)
Follow-up Telemedicine Note  RE: Renee Chambers MRN: [id number] DOB: 07-05-1966 Date of Telemedicine Visit: 10/30/2018  Primary care provider: Patient, No Pcp Per Referring provider: Macy Mis, MD  Telemedicine Follow Up Visit via Telephone: I connected with Renee Chambers for a follow up on 10/30/18 by telephone and verified that I am speaking with the correct person using two identifiers.   The limitations, risks, security and privacy concerns of performing an evaluation and management service by telemedicine, the availability of in person appointments, and that there may be a patient responsible charge related to this service were discussed. The patient expressed understanding and agreed to proceed.  Patient is at home.  Provider is at the office.  Visit start time: 9:54 am Visit end time: 10:18 am Insurance consent/check in by: Victorino Dike Medical consent and medical assistant/nurse: Morrie Sheldon  History of present illness: Renee Chambers is a 53 y.o. female with mixed rhinitis and history of bronchitis presenting today via telemedicine for follow-up.  She was last seen in this clinic in October 2019.  She reports that she sleeps with her bedroom windows open at night and in the morning she has been waking up with her "eyes full of minor", ocular pruritus, nasal congestion, and drainage.  She is currently taking montelukast daily and chlorpheniramine as needed.  She is currently not medicated nasal spray or allergy eyedrop.  She has tried azelastine nasal spray and Flonase in the past without adequate symptom relief.  She does report that montelukast has made "a huge difference" in that she has not had bronchitis in the spring or fall over the past 2 years whereas prior to montelukast the bronchitis occurred regularly.  She carries albuterol HFA in case of lower respiratory symptoms.  Assessment and plan: Allergic rhinitis Currently with suboptimal control.  Aeroallergen  avoidance measures have been provided.  Continue montelukast 10 mg daily at bedtime.  The boxed warning has been discussed.  A refill prescription has been provided.  A prescription has been provided for [facility], 2 actuations per nostril twice a day. Proper technique has been discussed and demonstrated.  Nasal saline spray (i.e., Simply Saline) or nasal saline lavage (i.e., NeilMed) is recommended as needed and prior to medicated nasal sprays.  A prescription has been provided for carbinoxamine 4 mg every 8 hours as needed.  If allergen avoidance measures and medications fail to adequately relieve symptoms, aeroallergen immunotherapy will be considered.  Allergic conjunctivitis  Treatment plan as outlined above for allergic rhinitis.  A prescription has been provided for Pazeo, one drop per eye daily as needed.  I have also recommended eye lubricant drops (i.e., Natural Tears) as needed.  History of bronchitis  For now continue montelukast 10 mg daily at bedtime and have access to albuterol HFA, 1 to 2 inhalations every 4-6 hours if needed.  Subjective and objective measures of pulmonary function will be followed and the treatment plan will be adjusted accordingly.   Meds ordered this encounter  Medications  . Fluticasone Propionate (XHANCE) 93 MCG/ACT EXHU    Sig: Place 2 sprays into the nose 2 (two) times a day.    Dispense:  32 mL    Refill:  5    [phone] (H)  . Carbinoxamine Maleate 4 MG TABS    Sig: Take 1 tablet (4 mg total) by mouth every 8 (eight) hours as needed.    Dispense:  90 each    Refill:  5  . Olopatadine HCl (PAZEO) 0.7 % SOLN  Sig: Place 1 drop into both eyes daily as needed.    Dispense:  1 Bottle    Refill:  5  . montelukast (SINGULAIR) 10 MG tablet    Sig: Take 1 tablet (10 mg total) by mouth at bedtime.    Dispense:  30 tablet    Refill:  5    Diagnostics: None.   Physical examination: Physical Exam Not obtained as encounter was done  via telephone.   The following portions of the patient's history were reviewed and updated as appropriate: allergies, current medications, past family history, past medical history, past social history, past surgical history and problem list.  Allergies as of 10/30/2018      Reactions   Morphine And Related Itching   Morphine And Related Itching   Tape Hives      Medication List       Accurate as of October 30, 2018 10:45 AM. Always use your most recent med list.        albuterol 108 (90 Base) MCG/ACT inhaler Commonly known as:  VENTOLIN HFA ProAir HFA 90 mcg/actuation aerosol inhaler   azelastine 0.1 % nasal spray Commonly known as:  ASTELIN Place 2 sprays into both nostrils 2 (two) times daily. Use in each nostril as directed   busPIRone 10 MG tablet Commonly known as:  BUSPAR Take 10 mg by mouth daily.   Carbinoxamine Maleate 4 MG Tabs Take 1 tablet (4 mg total) by mouth every 8 (eight) hours as needed.   CYCLOBENZAPRINE HCL PO Take by mouth as needed.   DULoxetine 30 MG capsule Commonly known as:  CYMBALTA duloxetine 30 mg capsule,delayed release   estradiol 0.0375 mg/24hr patch Commonly known as:  CLIMARA - Dosed in mg/24 hr estradiol 0.0375 mg/24 hr weekly transdermal patch  APPLY 1 PATCH TOPICALLY ONCE A WEEK   Fluticasone Propionate 93 MCG/ACT Exhu Commonly known as:  Xhance Place 2 sprays into the nose 2 (two) times a day.   ibuprofen 600 MG tablet Commonly known as:  ADVIL Take 1 tablet (600 mg total) by mouth every 6 (six) hours as needed (mild pain).   montelukast 10 MG tablet Commonly known as:  SINGULAIR Take 1 tablet (10 mg total) by mouth at bedtime.   naproxen 500 MG tablet Commonly known as:  NAPROSYN naproxen 500 mg tablet   Olopatadine HCl 0.7 % Soln Commonly known as:  Pazeo Place 1 drop into both eyes daily as needed.       Allergies  Allergen Reactions  . Morphine And Related Itching  . Morphine And Related Itching  . Tape  Hives   Review of systems: Review of systems negative except as noted in HPI / PMHx or noted below: Constitutional: Negative.  HENT: Negative.   Eyes: Negative.  Respiratory: Negative.   Cardiovascular: Negative.  Gastrointestinal: Negative.  Genitourinary: Negative.  Musculoskeletal: Negative.  Neurological: Negative.  Endo/Heme/Allergies: Negative.  Cutaneous: Negative.  Past Medical History:  Diagnosis Date  . Medical history non-contributory     Family History  Problem Relation Age of Onset  . Hypertension Father   . CAD Father   . Allergic rhinitis Brother   . Hypertension Brother   . Asthma Neg Hx     Social History   Socioeconomic History  . Marital status: Married    Spouse name: Not on file  . Number of children: Not on file  . Years of education: Not on file  . Highest education level: Not on file  Occupational History  .  Not on file  Social Needs  . Financial resource strain: Not on file  . Food insecurity:    Worry: Not on file    Inability: Not on file  . Transportation needs:    Medical: Not on file    Non-medical: Not on file  Tobacco Use  . Smoking status: Never Smoker  . Smokeless tobacco: Never Used  Substance and Sexual Activity  . Alcohol use: No  . Drug use: No  . Sexual activity: Yes    Birth control/protection: None  Lifestyle  . Physical activity:    Days per week: Not on file    Minutes per session: Not on file  . Stress: Not on file  Relationships  . Social connections:    Talks on phone: Not on file    Gets together: Not on file    Attends religious service: Not on file    Active member of club or organization: Not on file    Attends meetings of clubs or organizations: Not on file    Relationship status: Not on file  . Intimate partner violence:    Fear of current or ex partner: Not on file    Emotionally abused: Not on file    Physically abused: Not on file    Forced sexual activity: Not on file  Other Topics  Concern  . Not on file  Social History Narrative  . Not on file    Previous notes and tests were reviewed.  I discussed the assessment and treatment plan with the patient. The patient was provided an opportunity to ask questions and all were answered. The patient agreed with the plan and demonstrated an understanding of the instructions.   The patient was advised to call back or seek an in-person evaluation if the symptoms worsen or if the condition fails to improve as anticipated.  I provided 24 minutes of non-face-to-face time during this encounter.  I appreciate the opportunity to take part in Renee Chambers's care. Please do not hesitate to contact me with questions.  Sincerely,   R. Jorene Guest, MD

## 2018-10-30 NOTE — Assessment & Plan Note (Signed)
   For now continue montelukast 10 mg daily at bedtime and have access to albuterol HFA, 1 to 2 inhalations every 4-6 hours if needed.  Subjective and objective measures of pulmonary function will be followed and the treatment plan will be adjusted accordingly.

## 2018-10-30 NOTE — Patient Instructions (Addendum)
Allergic rhinitis Currently with suboptimal control.  Aeroallergen avoidance measures have been provided.  Continue montelukast 10 mg daily at bedtime.  The boxed warning has been discussed.  A refill prescription has been provided.  A prescription has been provided for [facility], 2 actuations per nostril twice a day. Proper technique has been discussed and demonstrated.  Nasal saline spray (i.e., Simply Saline) or nasal saline lavage (i.e., NeilMed) is recommended as needed and prior to medicated nasal sprays.  A prescription has been provided for carbinoxamine 4 mg every 8 hours as needed.  If allergen avoidance measures and medications fail to adequately relieve symptoms, aeroallergen immunotherapy will be considered.  Allergic conjunctivitis  Treatment plan as outlined above for allergic rhinitis.  A prescription has been provided for Pazeo, one drop per eye daily as needed.  I have also recommended eye lubricant drops (i.e., Natural Tears) as needed.  History of bronchitis  For now continue montelukast 10 mg daily at bedtime and have access to albuterol HFA, 1 to 2 inhalations every 4-6 hours if needed.  Subjective and objective measures of pulmonary function will be followed and the treatment plan will be adjusted accordingly.   Return in about 4 months (around 03/01/2019), or if symptoms worsen or fail to improve.  Reducing Pollen Exposure  The American Academy of Allergy, Asthma and Immunology suggests the following steps to reduce your exposure to pollen during allergy seasons.    1. Do not hang sheets or clothing out to dry; pollen may collect on these items. 2. Do not mow lawns or spend time around freshly cut grass; mowing stirs up pollen. 3. Keep windows closed at night.  Keep car windows closed while driving. 4. Minimize morning activities outdoors, a time when pollen counts are usually at their highest. 5. Stay indoors as much as possible when pollen counts or  humidity is high and on windy days when pollen tends to remain in the air longer. 6. Use air conditioning when possible.  Many air conditioners have filters that trap the pollen spores. 7. Use a HEPA room air filter to remove pollen form the indoor air you breathe.

## 2018-10-30 NOTE — Assessment & Plan Note (Addendum)
Currently with suboptimal control.  Aeroallergen avoidance measures have been provided.  Continue montelukast 10 mg daily at bedtime.  The boxed warning has been discussed.  A refill prescription has been provided.  A prescription has been provided for [facility], 2 actuations per nostril twice a day. Proper technique has been discussed and demonstrated.  Nasal saline spray (i.e., Simply Saline) or nasal saline lavage (i.e., NeilMed) is recommended as needed and prior to medicated nasal sprays.  A prescription has been provided for carbinoxamine 4 mg every 8 hours as needed.  If allergen avoidance measures and medications fail to adequately relieve symptoms, aeroallergen immunotherapy will be considered.

## 2018-11-06 ENCOUNTER — Other Ambulatory Visit: Payer: Self-pay | Admitting: Allergy and Immunology

## 2018-11-06 MED ORDER — OLOPATADINE HCL 0.1 % OP SOLN: 1.0000 [drp] | Freq: Two times a day (BID) | OPHTHALMIC | 5 refills | Status: DC

## 2019-03-01 ENCOUNTER — Telehealth: Payer: Self-pay | Admitting: Allergy and Immunology

## 2019-03-01 NOTE — Telephone Encounter (Signed)
Renee Chambers called me to get pricing for her office visit on Monday. She has [employer] and will need to pay out of pocket. She stated all she needs is refills. Can we give her refills to last till the end of the year until her new insurance kicks in?  [place] in [employer]

## 2019-03-01 NOTE — Telephone Encounter (Signed)
Dr. Verlin Fester please advise if it is ok to give the patient enough refills to last until the end of the year given the circumstances with her insurance.

## 2019-03-03 NOTE — Telephone Encounter (Signed)
Yes, that is fine. Thanks. 

## 2019-03-04 ENCOUNTER — Encounter: Payer: Self-pay | Admitting: Allergy and Immunology

## 2019-03-04 ENCOUNTER — Telehealth (INDEPENDENT_AMBULATORY_CARE_PROVIDER_SITE_OTHER): Payer: BLUE CROSS/BLUE SHIELD | Admitting: Allergy and Immunology

## 2019-03-04 ENCOUNTER — Ambulatory Visit: Payer: BLUE CROSS/BLUE SHIELD | Admitting: Allergy and Immunology

## 2019-03-04 ENCOUNTER — Other Ambulatory Visit: Payer: Self-pay

## 2019-03-04 DIAGNOSIS — J3089 Other allergic rhinitis: Secondary | ICD-10-CM

## 2019-03-04 DIAGNOSIS — J45909 Unspecified asthma, uncomplicated: Secondary | ICD-10-CM

## 2019-03-04 DIAGNOSIS — R442 Other hallucinations: Secondary | ICD-10-CM | POA: Diagnosis not present

## 2019-03-04 DIAGNOSIS — J4520 Mild intermittent asthma, uncomplicated: Secondary | ICD-10-CM

## 2019-03-04 HISTORY — DX: Unspecified asthma, uncomplicated: J45.909

## 2019-03-04 MED ORDER — AZELASTINE HCL 0.1 % NA SOLN: 2.0000 | Freq: Two times a day (BID) | NASAL | 3 refills | Status: DC | PRN

## 2019-03-04 MED ORDER — OLOPATADINE HCL 0.1 % OP SOLN: 1.0000 [drp] | Freq: Two times a day (BID) | OPHTHALMIC | 5 refills | Status: DC

## 2019-03-04 MED ORDER — MONTELUKAST SODIUM 10 MG PO TABS: 10.0000 mg | ORAL_TABLET | Freq: Every day | ORAL | 5 refills | Status: DC

## 2019-03-04 MED ORDER — AZELASTINE HCL 0.1 % NA SOLN: 2.0000 | Freq: Two times a day (BID) | NASAL | 5 refills | Status: DC | PRN

## 2019-03-04 MED ORDER — ALBUTEROL SULFATE HFA 108 (90 BASE) MCG/ACT IN AERS: INHALATION_SPRAY | RESPIRATORY_TRACT | 3 refills | Status: DC

## 2019-03-04 MED ORDER — XHANCE 93 MCG/ACT NA EXHU: 2.0000 | INHALANT_SUSPENSION | Freq: Two times a day (BID) | NASAL | 5 refills | Status: DC

## 2019-03-04 MED ORDER — CARBINOXAMINE MALEATE 4 MG PO TABS: 4.0000 mg | ORAL_TABLET | Freq: Three times a day (TID) | ORAL | 5 refills | Status: DC | PRN

## 2019-03-04 NOTE — Telephone Encounter (Signed)
All refills sent into pharmacy

## 2019-03-04 NOTE — Progress Notes (Signed)
Follow-up Webex Note  RE: Renee Chambers [place] MRN: [id number] DOB: 04/11/1966 Date of Telemedicine Visit: 03/04/2019  Primary care provider: Verlon AuBoyd, Tammy Lamonica, MD Referring provider: No ref. provider found  Telemedicine Follow Up Visit via WebEx: I connected with Renee Chambers for a follow up on 03/04/19 via MyChart virtual visit and verified that I am speaking with the correct person using two identifiers.   The limitations, risks, security and privacy concerns of performing an evaluation and management service by telemedicine, the availability of in person appointments, and that there may be a patient responsible charge related to this service were discussed. The patient expressed understanding and agreed to proceed.  Patient is at home.  Provider is at the office.  Visit start time: 3:29 pm Visit end time: 3:46 pm Insurance consent/check in by: Morrie SheldonAshley Medical consent and medical assistant/nurse: [place]  History of present illness: Renee Chambers Stahlecker is a 53 y.o. female with mixed rhinitis, asthmatic bronchitis, and history of Chambers Renee presenting today for follow-up via virtual visit.  She was last seen in this clinic in October 2019.  She reports that her upper and lower respiratory symptoms have been well controlled with montelukast daily and azelastine nasal spray as needed.  She feels that montelukast "definitely" helps to reduce her lower respiratory symptoms and is pleased that she has not had an episode of bronchitis over the past 2 years while on montelukast.  She reports [facility] and has not recurred over the past 4 months.  She needs refill prescriptions today.  Assessment and plan: Asthmatic bronchitis Well-controlled.  For now, continue montelukast 10 mg daily at bedtime and albuterol HFA, 1 to 2 inhalations every 4-6 hours if needed.  Refill prescriptions have been provided.  Subjective and objective measures of pulmonary function will be  followed and the treatment plan will be adjusted accordingly.  Allergic rhinitis Stable.  Continue appropriate allergen avoidance measures, montelukast 10 mg daily, and azelastine nasal spray as needed.  Nasal saline spray (i.e., Simply Saline) or nasal saline lavage (i.e., NeilMed) is recommended as needed and prior to medicated nasal sprays.  Phantosmia Resolved.  If this problem recurs, persist, or progresses, ENT evaluation will be warranted.   Meds ordered this encounter  Medications   DISCONTD: montelukast (SINGULAIR) 10 MG tablet    Sig: Take 1 tablet (10 mg total) by mouth at bedtime.    Dispense:  30 tablet    Refill:  5   DISCONTD: azelastine (ASTELIN) 0.1 % nasal spray    Sig: Place 2 sprays into both nostrils 2 (two) times daily as needed.    Dispense:  30 mL    Refill:  3   montelukast (SINGULAIR) 10 MG tablet    Sig: Take 1 tablet (10 mg total) by mouth at bedtime.    Dispense:  30 tablet    Refill:  5   azelastine (ASTELIN) 0.1 % nasal spray    Sig: Place 2 sprays into both nostrils 2 (two) times daily as needed.    Dispense:  30 mL    Refill:  5    Diagnostics: None.  Physical examination: Physical Exam Not obtained as encounter was done via MyChart virtual visit.  Previous notes and tests were reviewed.  The following portions of the patient's history were reviewed and updated as appropriate: allergies, current medications, past family history, past medical history, past social history, past surgical history and problem list.  Allergies as of 03/04/2019  Reactions   Morphine And Related Itching   Morphine And Related Itching   Tape Hives      Medication List       Accurate as of March 04, 2019  5:14 PM. If you have any questions, ask your nurse or doctor.        albuterol 108 (90 Base) MCG/ACT inhaler Commonly known as: VENTOLIN HFA ProAir HFA 90 mcg/actuation aerosol inhaler   azelastine 0.1 % nasal spray Commonly known as:  ASTELIN Place 2 sprays into both nostrils 2 (two) times daily as needed.   busPIRone 10 MG tablet Commonly known as: BUSPAR Take 10 mg by mouth daily.   Carbinoxamine Maleate 4 MG Tabs Take 1 tablet (4 mg total) by mouth every 8 (eight) hours as needed.   CYCLOBENZAPRINE HCL PO Take by mouth as needed.   DULoxetine 30 MG capsule Commonly known as: CYMBALTA duloxetine 30 mg capsule,delayed release   estradiol 0.0375 mg/24hr patch Commonly known as: CLIMARA - Dosed in mg/24 hr estradiol 0.0375 mg/24 hr weekly transdermal patch  APPLY 1 PATCH TOPICALLY ONCE A WEEK   ibuprofen 600 MG tablet Commonly known as: ADVIL Take 1 tablet (600 mg total) by mouth every 6 (six) hours as needed (mild pain).   montelukast 10 MG tablet Commonly known as: SINGULAIR Take 1 tablet (10 mg total) by mouth at bedtime.   naproxen 500 MG tablet Commonly known as: NAPROSYN naproxen 500 mg tablet   olopatadine 0.1 % ophthalmic solution Commonly known as: PATANOL Place 1 drop into both eyes 2 (two) times daily.   Xhance 93 MCG/ACT Exhu Generic drug: Fluticasone Propionate Place 2 sprays into the nose 2 (two) times daily.       Allergies  Allergen Reactions   Morphine And Related Itching   Morphine And Related Itching   Tape Hives    I discussed the assessment and treatment plan with the patient. The patient was provided an opportunity to ask questions and all were answered. The patient agreed with the plan and demonstrated an understanding of the instructions.   The patient was advised to call back or seek an in-person evaluation if the symptoms worsen or if the condition fails to improve as anticipated.  I provided 15 minutes of non-face-to-face time during this encounter.  I appreciate the opportunity to take part in [name]'s care. Please do not hesitate to contact me with questions.  Sincerely,   R. Edgar Frisk, MD

## 2019-03-04 NOTE — Assessment & Plan Note (Signed)
Resolved.  If this problem recurs, persist, or progresses, ENT evaluation will be warranted.

## 2019-03-04 NOTE — Assessment & Plan Note (Signed)
Well-controlled.  For now, continue montelukast 10 mg daily at bedtime and albuterol HFA, 1 to 2 inhalations every 4-6 hours if needed.  Refill prescriptions have been provided.  Subjective and objective measures of pulmonary function will be followed and the treatment plan will be adjusted accordingly.

## 2019-03-04 NOTE — Patient Instructions (Addendum)
Asthmatic bronchitis Well-controlled.  For now, continue montelukast 10 mg daily at bedtime and albuterol HFA, 1 to 2 inhalations every 4-6 hours if needed.  Refill prescriptions have been provided.  Subjective and objective measures of pulmonary function will be followed and the treatment plan will be adjusted accordingly.  Allergic rhinitis Stable.  Continue appropriate allergen avoidance measures, montelukast 10 mg daily, and azelastine nasal spray as needed.  Nasal saline spray (i.e., Simply Saline) or nasal saline lavage (i.e., NeilMed) is recommended as needed and prior to medicated nasal sprays.  Phantosmia Resolved.  If this problem recurs, persist, or progresses, ENT evaluation will be warranted.   Return in about 6 months (around 09/04/2019), or if symptoms worsen or fail to improve.

## 2019-03-04 NOTE — Assessment & Plan Note (Signed)
Stable.  Continue appropriate allergen avoidance measures, montelukast 10 mg daily, and azelastine nasal spray as needed.  Nasal saline spray (i.e., Simply Saline) or nasal saline lavage (i.e., NeilMed) is recommended as needed and prior to medicated nasal sprays.

## 2019-10-25 ENCOUNTER — Telehealth: Payer: Self-pay | Admitting: *Deleted

## 2019-10-25 ENCOUNTER — Other Ambulatory Visit: Payer: Self-pay | Admitting: Allergy

## 2019-10-25 MED ORDER — MONTELUKAST SODIUM 10 MG PO TABS: 10.0000 mg | ORAL_TABLET | Freq: Every day | ORAL | 0 refills | Status: DC

## 2019-10-25 NOTE — Telephone Encounter (Signed)
REFILLED MONTELUKAST X 1. PT HAS APPT 11/07/19 WITH [facility].

## 2019-10-28 ENCOUNTER — Other Ambulatory Visit: Payer: Self-pay

## 2019-10-28 ENCOUNTER — Ambulatory Visit: Payer: BLUE CROSS/BLUE SHIELD | Admitting: Allergy and Immunology

## 2019-10-28 MED ORDER — MONTELUKAST SODIUM 10 MG PO TABS: 10.0000 mg | ORAL_TABLET | Freq: Every day | ORAL | 0 refills | Status: DC

## 2019-11-07 ENCOUNTER — Ambulatory Visit: Payer: BLUE CROSS/BLUE SHIELD | Admitting: Allergy

## 2019-11-14 ENCOUNTER — Encounter: Payer: Self-pay | Admitting: Family Medicine

## 2019-11-14 ENCOUNTER — Other Ambulatory Visit: Payer: Self-pay

## 2019-11-14 ENCOUNTER — Ambulatory Visit (INDEPENDENT_AMBULATORY_CARE_PROVIDER_SITE_OTHER): Payer: 59 | Admitting: Family Medicine

## 2019-11-14 VITALS — BP 122/78 | HR 104 | Temp 98.5°F | Resp 16 | Ht 64.5 in | Wt 187.0 lb

## 2019-11-14 DIAGNOSIS — J4520 Mild intermittent asthma, uncomplicated: Secondary | ICD-10-CM | POA: Diagnosis not present

## 2019-11-14 DIAGNOSIS — H1013 Acute atopic conjunctivitis, bilateral: Secondary | ICD-10-CM

## 2019-11-14 DIAGNOSIS — J3089 Other allergic rhinitis: Secondary | ICD-10-CM

## 2019-11-14 MED ORDER — MONTELUKAST SODIUM 10 MG PO TABS: 10.0000 mg | ORAL_TABLET | Freq: Every day | ORAL | 5 refills | Status: DC

## 2019-11-14 MED ORDER — CARBINOXAMINE MALEATE 4 MG PO TABS: 4.0000 mg | ORAL_TABLET | Freq: Four times a day (QID) | ORAL | 5 refills | Status: DC | PRN

## 2019-11-14 NOTE — Patient Instructions (Addendum)
Mixed rhinitis Stop azelastine nasal spray as it was not working for you. 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  Call the clinic if this treatment plan is not working well for you  Follow up in 6 months or sooner if needed.

## 2019-11-14 NOTE — Progress Notes (Addendum)
[address] [place] [address] Dept: [phone]  FOLLOW UP NOTE  Patient ID: Renee Chambers, female    DOB: Jan 06, 1966  Age: 54 y.o. MRN: [id number] Date of Office Visit: 11/14/2019  Assessment  Chief Complaint: Allergies  HPI Renee Chambers is a 54 year old female who presents for follow-up allergic rhinitis, asthmatic bronchitis and and phantosmia.  She was last seen on March 04, 2019 by Dr. Nunzio Cobbs.  Allergic rhinitis is reported as moderately controlled with the use of carbinoxamine every 8 hours.  She reports postnasal drip will occur after about 6 hours of taking carbinoxamine.  She denies any rhinorrhea, nasal congestion, sneezing or nasal pruritus.  She reports that azelastine nasal spray would burn her sinuses and make them feel uncomfortable.  She also did not feel that sinus rinses helped much with her postnasal drip.  Allergic conjunctivitis is reported as moderately controlled with the use of Pataday eyedrops.  She reports that springtime is her worst season.  Asthmatic bronchitis is reported as well controlled with montelukast 10 mg once a day and albuterol as needed.  She reports that she is not had bronchitis and ever 2 years and that she feels montelukast has helped with her not having bronchitis.  She denies any wheezing, shortness of breath or tightness in her chest.  Since her last appointment she has not required any systemic steroids or trips to the emergency room or urgent care.  She reports that she will cough occasionally just to clear her postnasal drip.  She uses her albuterol maybe once a month for chest tightness and her symptoms will resolve.  She continues to have phantom smell that will occur approximately once every 2 months.  This smell will last anywhere from a couple hours to a couple days.  She notices a correlation with the phantom smell and increased stress and anxiety. Current medications are listed in chart.  Drug Allergies:    Allergies  Allergen Reactions  . Morphine And Related Itching  . Morphine And Related Itching  . Tape Hives    Physical Exam: BP 122/78   Pulse (!) 104   Temp 98.5 F (36.9 C) (Oral)   Resp 16   Ht 5' 4.5" (1.638 m)   Wt 187 lb (84.8 kg)   SpO2 94%   BMI 31.60 kg/m    Physical Exam Constitutional:      Appearance: Normal appearance.  HENT:     Head: Normocephalic and atraumatic.     Comments: Pharynx normal. Eyes normal. Ears normal. Nose: slightly erythematous.    Right Ear: Tympanic membrane, ear canal and external ear normal.     Left Ear: Tympanic membrane, ear canal and external ear normal.     Mouth/Throat:     Mouth: Mucous membranes are moist.     Pharynx: Oropharynx is clear.  Eyes:     Conjunctiva/sclera: Conjunctivae normal.  Cardiovascular:     Rate and Rhythm: Regular rhythm.     Heart sounds: Normal heart sounds.  Pulmonary:     Effort: Pulmonary effort is normal.     Breath sounds: Normal breath sounds.     Comments: Lungs clear to auscultation Skin:    General: Skin is warm.  Neurological:     General: No focal deficit present.     Mental Status: She is alert and oriented to person, place, and time.  Psychiatric:        Mood and Affect: Mood normal.  Behavior: Behavior normal.        Thought Content: Thought content normal.        Judgment: Judgment normal.     Diagnostics: FVC 3.70 L, FEV1 3.17 L.  Predicted FVC 3.62 L, FEV1 2.85 L.  Spirometry indicates normal ventilatory function.  Assessment and Plan: 1. Mild intermittent asthmatic bronchitis without complication   2. Allergic rhinitis   3. Allergic conjunctivitis of both eyes     No orders of the defined types were placed in this encounter.   Patient Instructions  Mixed rhinitis Stop azelastine nasal spray as it was not working for you. Increase carbinoxamine to 4 mg- take one tablet every 6 hours as needed for post nasal drainage May use saline nasal rinse as  needed  Asmatic bronchitis Continue montelukast 10 mg once a day to prevent cough or wheeze Use albuterol as needed 2 puffs every 4 hours for coughing, wheezing, tightness in chest or shortness of breath  Phantosmia refer to ENT for off/on phantosmia.  Call the clinic if this treatment plan is not working well for you  Follow up in 6 months or sooner if needed.    Return in about 6 months (around 05/16/2020), or if symptoms worsen or fail to improve.    Thank you for the opportunity to care for this patient.  Please do not hesitate to contact me with questions.  Althea Charon, FNP [facility]  ________________________________________________  I have provided oversight concerning [facility] evaluation and treatment of this patient's health issues addressed during today's encounter.  I agree with the assessment and therapeutic plan as outlined in the note.   Signed,   R Edgar Frisk, MD

## 2019-11-18 ENCOUNTER — Telehealth: Payer: Self-pay

## 2019-11-18 NOTE — Telephone Encounter (Signed)
-----   Message from Nehemiah Settle, FNP sent at 11/14/2019  2:29 PM EDT ----- Please refer to see Dr. Suszanne Conners for phantosmia despite aggressive treatment.  Thank you!!

## 2019-11-18 NOTE — Telephone Encounter (Signed)
Referral has been placed to Dr Luther Hearing office for scheduling.  I called to make sure they do take the patients insurance.  I have sent a mychart message to the patient with this info.  Thanks

## 2019-11-18 NOTE — Telephone Encounter (Signed)
Thank you for your help.

## 2020-05-18 ENCOUNTER — Ambulatory Visit: Payer: 59 | Admitting: Allergy and Immunology

## 2020-05-25 ENCOUNTER — Ambulatory Visit: Payer: 59 | Admitting: Family Medicine

## 2020-05-27 ENCOUNTER — Other Ambulatory Visit: Payer: Self-pay

## 2020-05-27 ENCOUNTER — Encounter: Payer: Self-pay | Admitting: Family Medicine

## 2020-05-27 ENCOUNTER — Ambulatory Visit (INDEPENDENT_AMBULATORY_CARE_PROVIDER_SITE_OTHER): Payer: 59 | Admitting: Family Medicine

## 2020-05-27 VITALS — BP 122/84 | HR 108 | Temp 99.1°F | Resp 16

## 2020-05-27 DIAGNOSIS — H1013 Acute atopic conjunctivitis, bilateral: Secondary | ICD-10-CM | POA: Diagnosis not present

## 2020-05-27 DIAGNOSIS — R442 Other hallucinations: Secondary | ICD-10-CM

## 2020-05-27 DIAGNOSIS — Z8709 Personal history of other diseases of the respiratory system: Secondary | ICD-10-CM

## 2020-05-27 DIAGNOSIS — J4520 Mild intermittent asthma, uncomplicated: Secondary | ICD-10-CM

## 2020-05-27 DIAGNOSIS — J3089 Other allergic rhinitis: Secondary | ICD-10-CM | POA: Diagnosis not present

## 2020-05-27 DIAGNOSIS — J4540 Moderate persistent asthma, uncomplicated: Secondary | ICD-10-CM

## 2020-05-27 NOTE — Patient Instructions (Addendum)
Asmatic bronchitis Continue montelukast 10 mg once a day to prevent cough or wheeze Use albuterol as needed 2 puffs every 4 hours for coughing, wheezing, tightness in chest or shortness of breath  Mixed rhinitis Continue carbinoxamine to 4 mg- take one tablet every 6 hours as needed for post nasal drainage May use saline nasal rinse as needed  Allergic conjunctivitis Continue Pataday one drop in each eye once a day as needed for red, itchy eyes Eye allergy handout given Samples of Zerviate given. Use 1 drop in each eye twice a day as needed for red or itchy eyes. If you begin Zerviate, then stop Pataday  Phantozmia Reshedule evaluation with Dr. Suszanne Conners If your symptoms re-occur, begin a journal of events that occurred for up to 6 hours before your symptoms began including foods and beverages consumed, soaps or perfumes you had contact with, and medications.    Call the clinic if this treatment plan is not working well for you  Follow up in 6 months or sooner if needed.

## 2020-05-27 NOTE — Progress Notes (Addendum)
[address] [place] [address] Dept: [phone]  FOLLOW UP NOTE  Patient ID: Renee Chambers, female    DOB: 07-Sep-1965  Age: 54 y.o. MRN: [id number] Date of Office Visit: 05/27/2020  Assessment  Chief Complaint: Asthma and Allergic Rhinitis   HPI Renee Chambers is a 54 year old female who presents the clinic for follow-up visit.  She was last seen in this clinic on 11/14/2019 by Nehemiah Settle, FNP, for evaluation of asthma, allergic rhinitis, allergic conjunctivitis, and phantosmia.  At today's visit, she reports her asthma has been well controlled with no shortness of breath, cough, or wheeze.  She continues montelukast 10 mg once a day and has not needed to use her albuterol since her last visit to this clinic.  Allergic rhinitis is reported as moderately well controlled with symptoms including slight nasal congestion occurring in the morning and postnasal drainage.  She reports that postnasal drainage has been better since she began taking carbinoxamine 4 mg every 6 hours.  Allergic conjunctivitis is reported as poorly controlled with symptoms including itching and burning.  She does report that she has been sleeping with the windows open over the last couple of days.  She continues olopatadine and Systane lubricating eyedrops with moderate relief of symptoms. Her current medications are listed in the chart.   Drug Allergies:  Allergies  Allergen Reactions   Morphine And Related Itching   Morphine And Related Itching   Tape Hives    Physical Exam: BP 122/84    Pulse (!) 108    Temp 99.1 F (37.3 C) (Tympanic)    Resp 16    SpO2 95%    Physical Exam Vitals reviewed.  Constitutional:      Appearance: Normal appearance.  HENT:     Head: Normocephalic and atraumatic.     Right Ear: Tympanic membrane normal.     Left Ear: Tympanic membrane normal.     Nose:     Comments: Bilateral nares normal. Pharynx slightly erythematous with no exudate.  Ears normal.  Eyes  normal. Eyes:     Conjunctiva/sclera: Conjunctivae normal.  Cardiovascular:     Rate and Rhythm: Normal rate and regular rhythm.     Heart sounds: Normal heart sounds. No murmur heard.   Pulmonary:     Effort: Pulmonary effort is normal.     Breath sounds: Normal breath sounds.     Comments: Lungs clear to auscultation Musculoskeletal:        General: Normal range of motion.     Cervical back: Normal range of motion and neck supple.  Skin:    General: Skin is warm and dry.  Neurological:     Mental Status: She is alert and oriented to person, place, and time.  Psychiatric:        Mood and Affect: Mood normal.        Behavior: Behavior normal.        Thought Content: Thought content normal.        Judgment: Judgment normal.     Diagnostics: FVC 3.69, FEV1 3.15.  Predicted FVC 3.60, predicted FEV1 2.83.  Spirometry indicates normal ventilatory function.  Assessment and Plan: 1. Moderate persistent asthma without complication   2. Allergic rhinitis   3. Allergic conjunctivitis of both eyes   4. Phantosmia   5. History of bronchitis     Patient Instructions  Asmatic bronchitis Continue montelukast 10 mg once a day to prevent cough or wheeze Use albuterol as needed 2 puffs every  4 hours for coughing, wheezing, tightness in chest or shortness of breath  Mixed rhinitis Continue carbinoxamine to 4 mg- take one tablet every 6 hours as needed for post nasal drainage May use saline nasal rinse as needed  Allergic conjunctivitis Continue Pataday one drop in each eye once a day as needed for red, itchy eyes Eye allergy handout given Samples of Zerviate given. Use 1 drop in each eye twice a day as needed for red or itchy eyes. If you begin Zerviate, then stop Pataday  Phantozmia Reshedule evaluation with Dr. Suszanne Conners If your symptoms re-occur, begin a journal of events that occurred for up to 6 hours before your symptoms began including foods and beverages consumed, soaps or  perfumes you had contact with, and medications.    Call the clinic if this treatment plan is not working well for you  Follow up in 6 months or sooner if needed.   Return in about 6 months (around 11/24/2020), or if symptoms worsen or fail to improve.    Thank you for the opportunity to care for this patient.  Please do not hesitate to contact me with questions.  Thermon Leyland, FNP [facility]  ________________________________________________  I have provided oversight concerning [facility]'s evaluation and treatment of this patient's health issues addressed during today's encounter.  I agree with the assessment and therapeutic plan as outlined in the note.   Signed,   R Jorene Guest, MD

## 2020-05-29 ENCOUNTER — Telehealth: Payer: Self-pay

## 2020-05-29 NOTE — Telephone Encounter (Signed)
Referral has been placed to their office for review and scheduling. I am sending the patient a mychart message with this information.

## 2020-05-29 NOTE — Telephone Encounter (Signed)
-----   Message from Hetty Blend, FNP sent at 05/27/2020  3:32 PM EST ----- This patient had a referral to Dr. Suszanne Conners at her last visit. She was unable to see Dr. Suszanne Conners at that time due to a family emergency. Can you please refer her to Dr. Suszanne Conners again for phamtosmia evaluation and treatment? Thank you

## 2020-05-29 NOTE — Telephone Encounter (Signed)
Thank you :)

## 2020-10-05 ENCOUNTER — Other Ambulatory Visit: Payer: Self-pay | Admitting: Family Medicine

## 2020-11-24 ENCOUNTER — Encounter: Payer: Self-pay | Admitting: Family Medicine

## 2020-11-24 ENCOUNTER — Other Ambulatory Visit: Payer: Self-pay

## 2020-11-24 ENCOUNTER — Ambulatory Visit (INDEPENDENT_AMBULATORY_CARE_PROVIDER_SITE_OTHER): Payer: 59 | Admitting: Family Medicine

## 2020-11-24 VITALS — BP 116/70 | HR 106 | Temp 98.0°F | Resp 17 | Ht 64.0 in | Wt 177.8 lb

## 2020-11-24 DIAGNOSIS — J4520 Mild intermittent asthma, uncomplicated: Secondary | ICD-10-CM

## 2020-11-24 DIAGNOSIS — R442 Other hallucinations: Secondary | ICD-10-CM

## 2020-11-24 DIAGNOSIS — H1013 Acute atopic conjunctivitis, bilateral: Secondary | ICD-10-CM | POA: Diagnosis not present

## 2020-11-24 DIAGNOSIS — J3089 Other allergic rhinitis: Secondary | ICD-10-CM

## 2020-11-24 MED ORDER — CARBINOXAMINE MALEATE 4 MG PO TABS: 4.0000 mg | ORAL_TABLET | Freq: Four times a day (QID) | ORAL | 5 refills | Status: DC | PRN

## 2020-11-24 MED ORDER — MONTELUKAST SODIUM 10 MG PO TABS: 1.0000 | ORAL_TABLET | Freq: Every day | ORAL | 5 refills | Status: DC

## 2020-11-24 NOTE — Progress Notes (Signed)
[address] [place] [address] Dept: [phone]  FOLLOW UP NOTE  Patient ID: Renee Chambers, female    DOB: March 10, 1966  Age: 54 y.o. MRN: [id number] Date of Office Visit: 11/24/2020  Assessment  Chief Complaint: Follow-up (Overall improvement and management.)  HPI Renee Chambers is a 55 year old female who presents to the clinic for a follow up visit. She was last seen in this clinic on 05/27/2020 for evaluation of asthma, allergic rhinitis, allergic conjunctivitis, and phantosmia.  At today's visit, she reports that asthmatic bronchitis has been well controlled with no shortness of breath, cough, or wheeze with activity or rest.  She continues montelukast 10 mg once a day and used albuterol for short time while she had COVID in February 2022 with relief of symptoms.  Allergic rhinitis is reported as moderately well controlled with occasional nasal congestion occurring mostly in the morning and a continuous amount of postnasal drainage.  She reports there has not been an increase in postnasal drainage since her last visit to this clinic.  She continues carbinoxamine 4 mg once or twice a day and is not currently using any nasal sprays or nasal saline rinse.  She occasionally takes Sudafed with relief of nasal congestion.  Allergic conjunctivitis is reported as poorly controlled with symptoms including itching and burning for which she continues Systane lubricating eyedrops and olopatadine with moderate relief of symptoms. She reports that she has developed a stye in the left lower eyelid that is recurrent over the last several months. She reports that she is using warm compresses and massage to resolve this issue. She continues to follow up with her primary care provider for evaluation and treatment of the stye. She reports symptoms of phantosmia occurring about once every 2 months and lasting for about 2 days.  She notes the smell is spicy, burning, and somewhat stinging.  She reports  these episodes occur when she is feeling an increase in mental stress.  She has not been to ear nose and throat specialists for further evaluation of phantosmia at this time. Her current medications are listed in the chart.   Drug Allergies:  Allergies  Allergen Reactions  . Morphine And Related Itching  . Morphine And Related Itching  . Tape Hives    Physical Exam: BP 116/70 (BP Location: Left Arm, Patient Position: Sitting)   Pulse (!) 106   Temp 98 F (36.7 C) (Temporal)   Resp 17   Ht 5\' 4"  (1.626 m)   Wt 177 lb 12.8 oz (80.6 kg)   SpO2 98%   BMI 30.52 kg/m    Physical Exam Vitals reviewed.  Constitutional:      Appearance: Normal appearance.  HENT:     Head: Normocephalic and atraumatic.     Right Ear: Tympanic membrane normal.     Left Ear: Tympanic membrane normal.     Nose:     Comments: Bilateral nares normal.  Pharynx normal.  Ears normal.  Right eyes normal.  Left eye with small stye like area in the outer lower lid. Neurological:     Mental Status: She is alert.     Diagnostics: FVC 3.44, FEV1 2.86.  Predicted FVC 3.36, predicted FEV1 2.68.  Spirometry indicates normal ventilatory function.  Assessment and Plan: 1. Mild intermittent asthmatic bronchitis without complication   2. Allergic rhinitis   3. Allergic conjunctivitis of both eyes   4. Phantosmia     Meds ordered this encounter  Medications  . Carbinoxamine Maleate  4 MG TABS    Sig: Take 1 tablet (4 mg total) by mouth every 6 (six) hours as needed.    Dispense:  120 tablet    Refill:  5  . montelukast (SINGULAIR) 10 MG tablet    Sig: Take 1 tablet (10 mg total) by mouth at bedtime.    Dispense:  90 tablet    Refill:  5    Patient Instructions  Asthma Continue montelukast 10 mg once a day to prevent cough or wheeze Use albuterol as needed 2 puffs every 4 hours for coughing, wheezing, tightness in chest or shortness of breath  Mixed rhinitis Continue carbinoxamine to 4 mg- take one  tablet every 6 hours as needed for post nasal drainage Continue saline nasal rinse as needed Continue azelastine 2 sprays in each nostril twice a day as needed for a runny nose Continue Flonase 2 sprays in each nostril once a day as needed for a stuffy nose  Allergic conjunctivitis Continue Pataday one drop in each eye once a day as needed for red, itchy eyes Continue a lubricating eye drop as needed You may find wrap around glasses helpful to protect your eyes from wind or pollen  Phantosmia Reshedule evaluation with Dr. Suszanne Conners if your symptoms are bothersome If your symptoms re-occur, begin a journal of events that occurred for up to 6 hours before your symptoms began including foods and beverages consumed, soaps or perfumes you had contact with, and medications.   Lesion of the eye/Stye Continue to follow up with your primary care provider regarding this issue  Call the clinic if this treatment plan is not working well for you  Follow up in 6 months or sooner if needed.    Return in about 6 months (around 05/27/2021), or if symptoms worsen or fail to improve.    Thank you for the opportunity to care for this patient.  Please do not hesitate to contact me with questions.  Thermon Leyland, FNP [facility]

## 2020-11-24 NOTE — Patient Instructions (Addendum)
Asthma °Continue montelukast 10 mg once a day to prevent cough or wheeze °Use albuterol as needed 2 puffs every 4 hours for coughing, wheezing, tightness in chest or shortness of breath ° °Mixed rhinitis °Continue carbinoxamine to 4 mg- take one tablet every 6 hours as needed for post nasal drainage °Continue saline nasal rinse as needed °Continue azelastine 2 sprays in each nostril twice a day as needed for a runny nose °Continue Flonase 2 sprays in each nostril once a day as needed for a stuffy nose ° °Allergic conjunctivitis °Continue Pataday one drop in each eye once a day as needed for red, itchy eyes °Continue a lubricating eye drop as needed °You may find wrap around glasses helpful to protect your eyes from wind or pollen ° °Phantosmia °Reshedule evaluation with Dr. Teoh if your symptoms are bothersome °If your symptoms re-occur, begin a journal of events that occurred for up to 6 hours before your symptoms began including foods and beverages consumed, soaps or perfumes you had contact with, and medications.  ° °Lesion of the eye/Stye °Continue to follow up with your primary care provider regarding this issue ° °Call the clinic if this treatment plan is not working well for you ° °Follow up in 6 months or sooner if needed. ° °

## 2021-06-08 ENCOUNTER — Ambulatory Visit: Payer: 59 | Admitting: Family Medicine

## 2021-06-08 NOTE — Patient Instructions (Incomplete)
Asthma Continue montelukast 10 mg once a day to prevent cough or wheeze Use albuterol as needed 2 puffs every 4 hours for coughing, wheezing, tightness in chest or shortness of breath  Mixed rhinitis Continue carbinoxamine to 4 mg- take one tablet every 6 hours as needed for post nasal drainage Continue saline nasal rinse as needed Continue azelastine 2 sprays in each nostril twice a day as needed for a runny nose Continue Flonase 2 sprays in each nostril once a day as needed for a stuffy nose  Allergic conjunctivitis Continue Pataday one drop in each eye once a day as needed for red, itchy eyes Continue a lubricating eye drop as needed You may find wrap around glasses helpful to protect your eyes from wind or pollen  Phantosmia Reshedule evaluation with Dr. Suszanne Conners if your symptoms are bothersome If your symptoms re-occur, begin a journal of events that occurred for up to 6 hours before your symptoms began including foods and beverages consumed, soaps or perfumes you had contact with, and medications.   Lesion of the eye/Stye Continue to follow up with your primary care provider regarding this issue  Call the clinic if this treatment plan is not working well for you  Follow up in 6 months or sooner if needed.

## 2021-06-08 NOTE — Progress Notes (Deleted)
   [address] [place] [address] Dept: [phone]  FOLLOW UP NOTE  Patient ID: Dannette Kinkaid, female    DOB: December 14, 1965  Age: 55 y.o. MRN: [id number] Date of Office Visit: 06/08/2021  Assessment  Chief Complaint: No chief complaint on file.  HPI Breia Ocampo is a 55 year old female who presents the clinic for follow-up visit.  She was last seen in this clinic on 11/24/2020 by Thermon Leyland, FNP, for evaluation of asthma, allergic rhinitis, allergic conjunctivitis, and phantosmia. Last environmental allergy skin testing was on 01/05/2016 and was positive to tree pollen, weed pollen, cockroach, and dust mite   Drug Allergies:  Allergies  Allergen Reactions   Morphine And Related Itching   Morphine And Related Itching   Tape Hives    Physical Exam: There were no vitals taken for this visit.   Physical Exam  Diagnostics:    Assessment and Plan: No diagnosis found.  No orders of the defined types were placed in this encounter.   There are no Patient Instructions on file for this visit.  No follow-ups on file.    Thank you for the opportunity to care for this patient.  Please do not hesitate to contact me with questions.  Thermon Leyland, FNP [facility]

## 2021-06-16 ENCOUNTER — Ambulatory Visit (INDEPENDENT_AMBULATORY_CARE_PROVIDER_SITE_OTHER): Payer: 59 | Admitting: Family Medicine

## 2021-06-16 ENCOUNTER — Other Ambulatory Visit: Payer: Self-pay

## 2021-06-16 ENCOUNTER — Encounter: Payer: Self-pay | Admitting: Family Medicine

## 2021-06-16 VITALS — BP 134/72 | HR 98 | Temp 98.0°F | Resp 16 | Ht 64.0 in | Wt 178.8 lb

## 2021-06-16 DIAGNOSIS — J4540 Moderate persistent asthma, uncomplicated: Secondary | ICD-10-CM | POA: Diagnosis not present

## 2021-06-16 DIAGNOSIS — R442 Other hallucinations: Secondary | ICD-10-CM | POA: Diagnosis not present

## 2021-06-16 DIAGNOSIS — H1013 Acute atopic conjunctivitis, bilateral: Secondary | ICD-10-CM

## 2021-06-16 DIAGNOSIS — J3089 Other allergic rhinitis: Secondary | ICD-10-CM | POA: Diagnosis not present

## 2021-06-16 NOTE — Patient Instructions (Addendum)
Asthma Continue montelukast 10 mg once a day to prevent cough or wheeze Continue albuterol 2 puffs once every 4 hours as needed for cough or wheeze He may use albuterol 2 puffs 5 to 15 minutes before activity to decrease cough or wheeze  Allergic rhinitis Continue carbinoxamine to 4 mg- take one tablet every 6 hours as needed for post nasal drainage Continue azelastine 2 sprays in each nostril twice a day as needed for a runny nose Continue Flonase 2 sprays in each nostril once a day as needed for a stuffy nose. In the right nostril, point the applicator out toward the right ear. In the left nostril, point the applicator out toward the left ear Consider saline nasal rinses as needed for nasal symptoms. Use this before any medicated nasal sprays for best result  Allergic conjunctivitis Continue Pataday one drop in each eye once a day as needed for red, itchy eyes Continue a lubricating eye drop as needed  Phantosmia Reshedule evaluation with Dr. Suszanne Conners if your symptoms are bothersome If your symptoms re-occur, begin a journal of events that occurred for up to 6 hours before your symptoms began including foods and beverages consumed, soaps or perfumes you had contact with, and medications.    Call the clinic if this treatment plan is not working well for you  Follow up in 6 months or sooner if needed.  Reducing Pollen Exposure The American Academy of All[employer], [employer] and [employer] suggests the following steps to reduce your exposure to pollen during allergy seasons. Do not hang sheets or clothing out to dry; pollen may collect on these items. Do not mow lawns or spend time around freshly cut grass; mowing stirs up pollen. Keep windows closed at night.  Keep car windows closed while driving. Minimize morning activities outdoors, a time when pollen counts are usually at their highest. Stay indoors as much as possible when pollen counts or humidity is high and on windy days when pollen tends to  remain in the air longer. Use air conditioning when possible.  Many air conditioners have filters that trap the pollen spores. Use a HEPA room air filter to remove pollen form the indoor air you breathe.  Control of Cockroach Allergen Cockroach allergen has been identified as an important cause of acute attacks of asthma, especially in urban settings.  There are fifty-five species of cockroach that exist in the [place], however only three, the [place], [place] species produce allergen that can affect patients with Asthma.  Allergens can be obtained from fecal particles, egg casings and secretions from cockroaches.    Remove food sources. Reduce access to water. Seal access and entry points. Spray runways with 0.5-1% Diazinon or Chlorpyrifos Blow boric acid power under stoves and refrigerator. Place bait stations (hydramethylnon) at feeding sites.    Control of Dust Mite Allergen Dust mites play a major role in allergic asthma and rhinitis. They occur in environments with high humidity wherever human skin is found. Dust mites absorb humidity from the atmosphere (ie, they do not drink) and feed on organic matter (including shed human and animal skin). Dust mites are a microscopic type of insect that you cannot see with the naked eye. High levels of dust mites have been detected from mattresses, pillows, carpets, upholstered furniture, bed covers, clothes, soft toys and any woven material. The principal allergen of the dust mite is found in its feces. A gram of dust may contain 1,000 mites and 250,000 fecal particles. Mite antigen is  easily measured in the air during house cleaning activities. Dust mites do not bite and do not cause harm to humans, other than by triggering allergies/asthma.  Ways to decrease your exposure to dust mites in your home:  1. Encase mattresses, box springs and pillows with a mite-impermeable barrier or cover  2. Wash sheets, blankets and drapes  weekly in hot water (130 F) with detergent and dry them in a dryer on the hot setting.  3. Have the room cleaned frequently with a vacuum cleaner and a damp dust-mop. For carpeting or rugs, vacuuming with a vacuum cleaner equipped with a high-efficiency particulate air (HEPA) filter. The dust mite allergic individual should not be in a room which is being cleaned and should wait 1 hour after cleaning before going into the room.  4. Do not sleep on upholstered furniture (eg, couches).  5. If possible removing carpeting, upholstered furniture and drapery from the home is ideal. Horizontal blinds should be eliminated in the rooms where the person spends the most time (bedroom, study, television room). Washable vinyl, roller-type shades are optimal.  6. Remove all non-washable stuffed toys from the bedroom. Wash stuffed toys weekly like sheets and blankets above.  7. Reduce indoor humidity to less than 50%. Inexpensive humidity monitors can be purchased at most hardware stores. Do not use a humidifier as can make the problem worse and are not recommended.

## 2021-06-16 NOTE — Progress Notes (Signed)
[address] [place] [address] Dept: [phone]  FOLLOW UP NOTE  Patient ID: Renee Chambers, female    DOB: 28-Oct-1965  Age: 55 y.o. MRN: [id number] Date of Office Visit: 06/16/2021  Assessment  Chief Complaint: Follow-up (Pt states all symptoms are well control at this moment. No refills needed.) and Asthma  HPI Renee Chambers is a 55 year old female who presents the clinic for follow-up visit.  She was last seen in this clinic on 11/24/2020 by Thermon Leyland, FNP, for evaluation of asthma, mixed rhinitis, allergic conjunctivitis, and phantosmia.  At today's visit, she reports her asthma has been well controlled with no shortness of breath, cough, or wheeze.  She continues montelukast 10 mg once a day and has not needed to use her albuterol since her last visit to this clinic.  Allergic conjunctivitis is reported as moderately well controlled with postnasal drainage as the main symptom.  She continues carbinoxamine 4 mg tablets once or twice a day and is not currently using Flonase, azelastine, or saline nasal rinses. Her last environmental allergy skin prick testing was on 01/05/2016 and was positive to tree pollen, weed pollen, cockroach, and dust mite. Allergic conjunctivitis is reported as moderately well controlled with dryness as her main symptom.  She occasionally uses olopatadine with relief of dryness or itch.  She reports that she has not experienced phantosmia since her last visit to this clinic.  Her current medications are listed in the chart.    Drug Allergies:  Allergies  Allergen Reactions   Morphine And Related Itching   Morphine And Related Itching   Tape Hives    Physical Exam: BP 134/72   Pulse 98   Temp 98 F (36.7 C) (Temporal)   Resp 16   Ht 5\' 4"  (1.626 m)   Wt 178 lb 12.8 oz (81.1 kg)   SpO2 97%   BMI 30.69 kg/m    Physical Exam Vitals reviewed.  Constitutional:      Appearance: Normal appearance.  HENT:     Head: Normocephalic and atraumatic.      Right Ear: Tympanic membrane normal.     Left Ear: Tympanic membrane normal.     Nose:     Comments: Bilateral nares slightly erythematous with clear nasal drainage noted.  Pharynx normal.  Ears normal.  Eyes normal.    Mouth/Throat:     Pharynx: Oropharynx is clear.  Eyes:     Conjunctiva/sclera: Conjunctivae normal.  Cardiovascular:     Rate and Rhythm: Normal rate and regular rhythm.     Heart sounds: Normal heart sounds. No murmur heard. Pulmonary:     Effort: Pulmonary effort is normal.     Breath sounds: Normal breath sounds.     Comments: Lungs clear to auscultation Musculoskeletal:        General: Normal range of motion.     Cervical back: Normal range of motion and neck supple.  Skin:    General: Skin is warm and dry.  Neurological:     Mental Status: She is alert and oriented to person, place, and time.  Psychiatric:        Mood and Affect: Mood normal.        Behavior: Behavior normal.        Thought Content: Thought content normal.        Judgment: Judgment normal.    Diagnostics: FVC 3.43, FEV1 2.75.  Predicted FVC 3.34.  Predicted FEV1 2.66.  Spirometry indicates normal ventilatory function.  Assessment  and Plan: 1. Moderate persistent asthma without complication   2. Allergic rhinitis   3. Allergic conjunctivitis of both eyes   4. Phantosmia     Patient Instructions  Asthma Continue montelukast 10 mg once a day to prevent cough or wheeze Continue albuterol 2 puffs once every 4 hours as needed for cough or wheeze He may use albuterol 2 puffs 5 to 15 minutes before activity to decrease cough or wheeze  Allergic rhinitis Continue carbinoxamine to 4 mg- take one tablet every 6 hours as needed for post nasal drainage Continue azelastine 2 sprays in each nostril twice a day as needed for a runny nose Continue Flonase 2 sprays in each nostril once a day as needed for a stuffy nose. In the right nostril, point the applicator out toward the right ear. In the  left nostril, point the applicator out toward the left ear Consider saline nasal rinses as needed for nasal symptoms. Use this before any medicated nasal sprays for best result  Allergic conjunctivitis Continue Pataday one drop in each eye once a day as needed for red, itchy eyes Continue a lubricating eye drop as needed  Phantosmia Reshedule evaluation with Dr. Suszanne Conners if your symptoms are bothersome If your symptoms re-occur, begin a journal of events that occurred for up to 6 hours before your symptoms began including foods and beverages consumed, soaps or perfumes you had contact with, and medications.    Call the clinic if this treatment plan is not working well for you  Follow up in 6 months or sooner if needed.   Return in about 6 months (around 12/15/2021), or if symptoms worsen or fail to improve.    Thank you for the opportunity to care for this patient.  Please do not hesitate to contact me with questions.  Thermon Leyland, FNP [facility]

## 2021-07-17 ENCOUNTER — Other Ambulatory Visit: Payer: Self-pay

## 2021-07-17 ENCOUNTER — Emergency Department (HOSPITAL_BASED_OUTPATIENT_CLINIC_OR_DEPARTMENT_OTHER)
Admission: EM | Admit: 2021-07-17 | Discharge: 2021-07-18 | Disposition: A | Discharge status: Home / Self Care | Payer: Commercial Managed Care - HMO | Attending: Emergency Medicine | Admitting: Emergency Medicine

## 2021-07-17 ENCOUNTER — Encounter (HOSPITAL_BASED_OUTPATIENT_CLINIC_OR_DEPARTMENT_OTHER): Payer: Self-pay | Admitting: Emergency Medicine

## 2021-07-17 ENCOUNTER — Emergency Department (HOSPITAL_BASED_OUTPATIENT_CLINIC_OR_DEPARTMENT_OTHER): Payer: Commercial Managed Care - HMO

## 2021-07-17 DIAGNOSIS — Z20822 Contact with and (suspected) exposure to covid-19: Secondary | ICD-10-CM | POA: Insufficient documentation

## 2021-07-17 DIAGNOSIS — G4453 Primary thunderclap headache: Secondary | ICD-10-CM | POA: Insufficient documentation

## 2021-07-17 DIAGNOSIS — R739 Hyperglycemia, unspecified: Secondary | ICD-10-CM | POA: Diagnosis not present

## 2021-07-17 DIAGNOSIS — R519 Headache, unspecified: Secondary | ICD-10-CM | POA: Diagnosis present

## 2021-07-17 MED ORDER — SODIUM CHLORIDE 0.9 % IV BOLUS
1000.0000 mL | Freq: Once | INTRAVENOUS | Status: AC
  Administered 2021-07-17: 1000 mL via INTRAVENOUS

## 2021-07-17 MED ORDER — ACETAMINOPHEN 500 MG PO TABS
1000.0000 mg | ORAL_TABLET | Freq: Once | ORAL | Status: AC
  Administered 2021-07-17: 1000 mg via ORAL
  Filled 2021-07-17: qty 2

## 2021-07-17 MED ORDER — METOCLOPRAMIDE HCL 5 MG/ML IJ SOLN
10.0000 mg | Freq: Once | INTRAMUSCULAR | Status: AC
  Administered 2021-07-17: 10 mg via INTRAVENOUS
  Filled 2021-07-17: qty 2

## 2021-07-17 MED ORDER — DIPHENHYDRAMINE HCL 50 MG/ML IJ SOLN
12.5000 mg | Freq: Once | INTRAMUSCULAR | Status: AC
  Administered 2021-07-17: 12.5 mg via INTRAVENOUS
  Filled 2021-07-17: qty 1

## 2021-07-17 MED ORDER — SODIUM CHLORIDE 0.9 % IV SOLN: INTRAVENOUS | Status: DC

## 2021-07-17 NOTE — ED Notes (Signed)
Patient transported to CT 

## 2021-07-17 NOTE — ED Triage Notes (Signed)
Reports a dull headache for four days.  Had a severe headache yesterday and got worse tonight.  Denies n/v or sensitivity to light.  Taking extra strength tylenol with caffeine.  Also took two naproxen with no relief.

## 2021-07-17 NOTE — ED Provider Notes (Signed)
[facility] EMERGENCY DEPARTMENT Provider Note   CSN: [id number] Arrival date & time: 07/17/21  2310     History  Chief Complaint  Patient presents with   Headache    Renee Chambers is a 56 y.o. female.   Headache  56 year old female with no significant medical history presenting to the emergency department with headache.  The patient states that her headache came on suddenly with maximal intensity 4 days ago while having intercourse.  Since then she has had a persistent dull throbbing headache, diffusely located in multiple spots throughout her skull, rating across her forehead, additionally in her posterior occiput.  No neck pain or stiffness or rigidity, no fevers or chills.  The patient has had roughly 4 episodes of maximal intensity headache since this time.  Her headache is never completely gone away.  1.5 hours prior to arrival tonight, the patient endorsed worsening sudden onset and maximal onset within 10 minutes intensity headache.  No nausea or vomiting.  No neurologic deficits.  No blurry vision.  No light sensitivity or sound sensitivity.  Home Medications Prior to Admission medications   Medication Sig Start Date End Date Taking? Authorizing Provider  albuterol (VENTOLIN HFA) 108 (90 Base) MCG/ACT inhaler ProAir HFA 90 mcg/actuation aerosol inhaler 03/04/19   Bobbitt, Sedalia Muta, MD  atorvastatin (LIPITOR) 20 MG tablet Take 1 tablet by mouth daily. 11/04/20   [provider]  azelastine (ASTELIN) 0.1 % nasal spray Place 2 sprays into both nostrils 2 (two) times daily as needed. 03/04/19   Bobbitt, Sedalia Muta, MD  buPROPion (WELLBUTRIN XL) 150 MG 24 hr tablet Take by mouth. 05/25/20   [provider]  busPIRone (BUSPAR) 15 MG tablet Take 15 mg by mouth 2 (two) times daily. 05/25/20   [provider]  Carbinoxamine Maleate 4 MG TABS Take 1 tablet (4 mg total) by mouth every 6 (six) hours as needed. 11/24/20   Dara Hoyer, FNP   clindamycin (CLEOCIN T) 1 % lotion Apply 1 application topically 2 (two) times daily. Patient not taking: Reported on 11/24/2020 11/12/19   [provider]  CYCLOBENZAPRINE HCL PO Take by mouth as needed.    [provider]  DOTTI 0.0375 MG/24HR 1 patch 2 (two) times a week. 09/06/19   [provider]  DULoxetine (CYMBALTA) 30 MG capsule duloxetine 30 mg capsule,delayed release    [provider]  Fluticasone Propionate (XHANCE) 93 MCG/ACT EXHU Place 2 sprays into the nose 2 (two) times daily. 03/04/19   Bobbitt, Sedalia Muta, MD  ibuprofen (ADVIL,MOTRIN) 600 MG tablet Take 1 tablet (600 mg total) by mouth every 6 (six) hours as needed (mild pain). 11/28/13   Paula Compton, MD  montelukast (SINGULAIR) 10 MG tablet Take 1 tablet (10 mg total) by mouth at bedtime. 11/24/20   Ambs, Kathrine Cords, FNP  mupirocin ointment (BACTROBAN) 2 % Apply 1 application topically 3 (three) times daily. Patient not taking: Reported on 05/27/2020 11/12/19   [provider]  naproxen (NAPROSYN) 500 MG tablet naproxen 500 mg tablet 01/05/18   [provider]  Olopatadine HCl 0.2 % SOLN Apply to eye.    [provider]      Allergies    Morphine and related, Morphine and related, and Tape    Review of Systems   Review of Systems  Neurological:  Positive for headaches.   Physical Exam Updated Vital Signs BP 101/78 (BP Location: Left Arm)    Pulse 93    Temp 98.5  F (36.9 C) (Oral)    Resp 18    Ht _0  (1.626 m)    Wt 81.6 kg    SpO2 97%    BMI 30.90 kg/m  Physical Exam Vitals and nursing note reviewed.  Constitutional:      General: She is not in acute distress.    Appearance: She is well-developed.  HENT:     Head: Normocephalic and atraumatic.  Eyes:     Conjunctiva/sclera: Conjunctivae normal.     Pupils: Pupils are equal, round, and reactive to light.  Cardiovascular:     Rate and Rhythm: Normal rate and regular rhythm.     Heart sounds: No  murmur heard. Pulmonary:     Effort: Pulmonary effort is normal. No respiratory distress.     Breath sounds: Normal breath sounds.  Abdominal:     General: There is no distension.     Palpations: Abdomen is soft.     Tenderness: There is no abdominal tenderness. There is no guarding.  Musculoskeletal:        General: No swelling, deformity or signs of injury.     Cervical back: Neck supple.  Skin:    General: Skin is warm and dry.     Capillary Refill: Capillary refill takes less than 2 seconds.     Findings: No lesion or rash.  Neurological:     General: No focal deficit present.     Mental Status: She is alert. Mental status is at baseline.     GCS: GCS eye subscore is 4. GCS verbal subscore is 5. GCS motor subscore is 6.     Cranial Nerves: No cranial nerve deficit, dysarthria or facial asymmetry.     Sensory: No sensory deficit.     Motor: No weakness.     Coordination: Coordination normal.     Comments: MENTAL STATUS EXAM:    Orientation: Alert and oriented to person, place and time.  Memory: Cooperative, follows commands well.  Language: Speech is clear and language is normal.   CRANIAL NERVES:    CN 2 (Optic): Visual fields intact to confrontation.  CN 3,4,6 (EOM): Pupils equal and reactive to light. Full extraocular eye movement without nystagmus.  CN 5 (Trigeminal): Facial sensation is normal, no weakness of masticatory muscles.  CN 7 (Facial): No facial weakness or asymmetry.  CN 8 (Auditory): Auditory acuity grossly normal.  CN 9,10 (Glossophar): The uvula is midline, the palate elevates symmetrically.  CN 11 (spinal access): Normal sternocleidomastoid and trapezius strength.  CN 12 (Hypoglossal): The tongue is midline. No atrophy or fasciculations.Marland Kitchen   MOTOR:  Muscle Strength: 5/5RUE, 5/5LUE, 5/5RLE, 5/5LLE.   COORDINATION:   Intact finger-to-nose, no tremor.   SENSATION:   Intact to light touch all four extremities.  GAIT: Gait not assessed   Psychiatric:         Mood and Affect: Mood normal.        Speech: Speech normal.    ED Results / Procedures / Treatments   Labs (all labs ordered are listed, but only abnormal results are displayed) Labs Reviewed  BASIC METABOLIC PANEL - Abnormal; Notable for the following components:      Result Value   Glucose, Bld 126 (*)    Calcium 8.8 (*)    All other components within normal limits  CSF CELL COUNT WITH DIFFERENTIAL - Abnormal; Notable for the following components:   Appearance, CSF CLEAR (*)    RBC Count, CSF 2 (*)    WBC,  CSF 6 (*)    All other components within normal limits  PROTEIN AND GLUCOSE, CSF - Abnormal; Notable for the following components:   Glucose, CSF 72 (*)    All other components within normal limits  CSF CELL COUNT WITH DIFFERENTIAL - Abnormal; Notable for the following components:   Appearance, CSF CLEAR (*)    All other components within normal limits  CSF CULTURE W GRAM STAIN  RESP PANEL BY RT-PCR (FLU A&B, COVID) ARPGX2  CBC WITH DIFFERENTIAL/PLATELET  SEDIMENTATION RATE    EKG None  Radiology CT Head Wo Contrast  Result Date: 07/17/2021 CLINICAL DATA:  Headache.  Concern for intracranial hemorrhage. EXAM: CT HEAD WITHOUT CONTRAST TECHNIQUE: Contiguous axial images were obtained from the base of the skull through the vertex without intravenous contrast. COMPARISON:  None. FINDINGS: Brain: The ventricles and sulci are appropriate size for the patient's age. The gray-white matter discrimination is preserved. There is no acute intracranial hemorrhage. No mass effect or midline shift. No extra-axial fluid collection. Vascular: No hyperdense vessel or unexpected calcification. Skull: Normal. Negative for fracture or focal lesion. Sinuses/Orbits: No acute finding. Other: None IMPRESSION: Unremarkable noncontrast CT of the brain. Electronically Signed   By: Anner Crete M.D.   On: 07/17/2021 23:50    Procedures .Lumbar Puncture  Date/Time: 07/18/2021 1:40 AM Performed  by: Regan Lemming, MD Authorized by: Regan Lemming, MD   Consent:    Consent obtained:  Verbal and written   Consent given by:  Patient   Risks, benefits, and alternatives were discussed: yes     Risks discussed:  Bleeding, infection, pain, repeat procedure, headache and nerve damage   Alternatives discussed:  No treatment and delayed treatment Universal protocol:    Patient identity confirmed:  Verbally with patient and arm band Pre-procedure details:    Procedure purpose:  Diagnostic   Preparation: Patient was prepped and draped in usual sterile fashion   Anesthesia:    Anesthesia method:  Local infiltration   Local anesthetic:  Lidocaine 1% w/o epi Procedure details:    Lumbar space:  L4-L5 interspace   Patient position:  Sitting   Needle gauge:  22   Needle type:  Spinal needle - Quincke tip   Needle length (in):  3.5   Ultrasound guidance: no     Number of attempts:  2   Fluid appearance:  Clear   Tubes of fluid:  4   Total volume (ml):  15 Post-procedure details:    Puncture site:  Adhesive bandage applied and direct pressure applied   Procedure completion:  Tolerated Comments:     No Xanthochromia noted    Medications Ordered in ED Medications  sodium chloride 0.9 % bolus 1,000 mL (0 mLs Intravenous Stopped 07/18/21 0052)    And  0.9 %  sodium chloride infusion ( Intravenous New Bag/Given 07/18/21 0056)  metoCLOPramide (REGLAN) injection 10 mg (10 mg Intravenous Given 07/17/21 2357)  diphenhydrAMINE (BENADRYL) injection 12.5 mg (12.5 mg Intravenous Given 07/17/21 2355)  acetaminophen (TYLENOL) tablet 1,000 mg (1,000 mg Oral Given 07/17/21 2338)  lidocaine (PF) (XYLOCAINE) 1 % injection (  Given 07/18/21 0142)  diphenhydrAMINE (BENADRYL) injection 12.5 mg (12.5 mg Intravenous Given 07/18/21 0231)  metoCLOPramide (REGLAN) injection 10 mg (10 mg Intravenous Given 07/18/21 0232)    ED Course/ Medical Decision Making/ A&P Clinical Course as of 07/18/21 0529  Sun Jul 18, 2021   0526 RBC Count, CSF(!): 2 [JL]  0526 WBC, CSF(!): 6 [JL]  0526 Appearance, CSF(!): CLEAR [  JL]  0529 Appearance, CSF(!): CLEAR [JL]  0529 RBC Count, CSF: 0 [JL]  0529 WBC, CSF: 1 [JL]    Clinical Course User Index [JL] Regan Lemming, MD                           Medical Decision Making  56 year old female with no significant medical history presenting to the emergency department with headache.  The patient states that her headache came on suddenly with maximal intensity 4 days ago while having intercourse.  Since then she has had a persistent dull throbbing headache, diffusely located in multiple spots throughout her skull, rating across her forehead, additionally in her posterior occiput.  No neck pain or stiffness or rigidity, no fevers or chills.  The patient has had roughly 4 episodes of maximal intensity headache since this time.  Her headache is never completely gone away.  1.5 hours prior to arrival tonight, the patient endorsed worsening sudden onset and maximal onset within 10 minutes intensity headache.  No nausea or vomiting.  No neurologic deficits.  No blurry vision.  No light sensitivity or sound sensitivity.  On arrival, the patient was afebrile, hemodynamically stable, mildly hypertensive BP 152/83, saturating well on room air.  She presents with a sudden thunderclap headache that came on 4 days ago and has persisted over the past 4 days.  She endorses worsening of her headache tonight suddenly 1.5 hours prior to arrival.  Symptoms came on during intercourse 4 days ago.  Patient denies any neurologic deficits and has a normal neurologic exam today.  Symptoms are considered to be the worst headache the patient has ever experienced.  Normal sinus rhythm noted on cardiac telemetry.  The patient on physical exam had no neurologic deficits.  She has no meningismus and is afebrile.  My primary concern is of subarachnoid hemorrhage with a thunderclap headache that came on during a period  of physical exertion.  Additional differential diagnosis includes: Intracranial aneurysm, tension type headache, migraine headache, less likely CVA, temporal arteritis, cavernous sinus thrombosis, carbon monoxide toxicity, carotid or vertebral artery dissection, acute angle glaucoma, meningitis/encephalitis.  Currently she is awake, alert, GCS 15, HDS, and afebrile. Her exam is most notable for fully intact extraocular motions with bilaterally reactive pupils, no focal neurologic deficits, no meningismus, and no temporal tenderness. There is no rash. The headache was sudden onset and is the worst headache of the patient's life. There is no visual deficit.  I am most concerned for [facility].  To further evaluate and risk stratify her, labs and imaging were obtained, which were significant for:  Labs: BMP with mild hyperglycemia to 126 otherwise unremarkable, CBC without a leukocytosis, anemia or platelet abnormality, ESR normal at 10.  A lumbar puncture was obtained.  Imaging: CT head reviewed by myself and radiology reveals no evidence of intracranial hemorrhage, however, we are now 4 days out from initial onset of symptoms making CT head less sensitive and specific.  Following negative CT head, lumbar puncture was obtained which revealed clear CSF.  No evidence of xanthochromia.  CSF fluid was sent to the lab for analysis.  On reassessment, the patient continued to endorse symptoms despite the above treatment interventions.  Additional IV Reglan and Benadryl ordered.  0400 Patient reassessed, currently states pain is around 1 out of 10 in severity.  Still pending CSF results as they had to be carried to the [facility] main laboratory.  4010 Lumbar puncture results revealed 6  WBCs, 2 RBCs.  Informed the patient of the results and negative likelihood of subarachnoid hemorrhage at this time.  CSF was also clear without evidence of xanthochromia.  Tube for revealed a white blood cell count of 1 and a red  blood cell count of 0.  Ambulatory referral for follow-up outpatient in clinic with neurology was placed.  On repeat assessment, the patient's headache symptoms had improved.   We participated in shared decision making regarding continued observation in the ED versus discharge home for continued recovery after receiving the above medications. She preferred to recover at home. I believe that this is safe and reasonable.  We have discussed the diagnosis and risks, and we agree with discharging home to follow-up with their primary doctor and neurology. We also discussed returning to the Emergency Department immediately if new or worsening symptoms occur. We have discussed the symptoms which are most concerning (e.g., changing or worsening pain, weakness, vomiting, fever, or abnormal sensation) that necessitate immediate return. I provided ED return precautions. The patient felt safe with this plan.  ED Medication Summary: Medications  sodium chloride 0.9 % bolus 1,000 mL (0 mLs Intravenous Stopped 07/18/21 0052)    And  0.9 %  sodium chloride infusion ( Intravenous New Bag/Given 07/18/21 0056)  metoCLOPramide (REGLAN) injection 10 mg (10 mg Intravenous Given 07/17/21 2357)  diphenhydrAMINE (BENADRYL) injection 12.5 mg (12.5 mg Intravenous Given 07/17/21 2355)  acetaminophen (TYLENOL) tablet 1,000 mg (1,000 mg Oral Given 07/17/21 2338)  lidocaine (PF) (XYLOCAINE) 1 % injection (  Given 07/18/21 0142)  diphenhydrAMINE (BENADRYL) injection 12.5 mg (12.5 mg Intravenous Given 07/18/21 0231)  metoCLOPramide (REGLAN) injection 10 mg (10 mg Intravenous Given 07/18/21 0232)     Final Clinical Impression(s) / ED Diagnoses Final diagnoses:  Thunderclap headache    Rx / DC Orders ED Discharge Orders          Ordered    Ambulatory referral to Neurology       Comments: An appointment is requested in approximately: 2 weeks   07/18/21 7425              Regan Lemming, MD 07/18/21 0530

## 2021-07-18 LAB — CSF CELL COUNT WITH DIFFERENTIAL
RBC Count, CSF: 0 /mm3
RBC Count, CSF: 2 /mm3 — ABNORMAL HIGH
Tube #: 1
Tube #: 4
WBC, CSF: 1 /mm3 (ref 0–5)
WBC, CSF: 6 /mm3 — ABNORMAL HIGH (ref 0–5)

## 2021-07-18 LAB — BASIC METABOLIC PANEL
Anion gap: 9 (ref 5–15)
BUN: 13 mg/dL (ref 6–20)
CO2: 25 mmol/L (ref 22–32)
Calcium: 8.8 mg/dL — ABNORMAL LOW (ref 8.9–10.3)
Chloride: 101 mmol/L (ref 98–111)
Creatinine, Ser: 0.74 mg/dL (ref 0.44–1.00)
GFR, Estimated: >60 mL/min (ref >60)
Glucose, Bld: 126 mg/dL — ABNORMAL HIGH (ref 70–99)
Potassium: 3.8 mmol/L (ref 3.5–5.1)
Sodium: 135 mmol/L (ref 135–145)

## 2021-07-18 LAB — CBC WITH DIFFERENTIAL/PLATELET
Abs Immature Granulocytes: 0.06 10*3/uL (ref 0.00–0.07)
Basophils Absolute: 0.1 10*3/uL (ref 0.0–0.1)
Basophils Relative: 1 %
Eosinophils Absolute: 0.1 10*3/uL (ref 0.0–0.5)
Eosinophils Relative: 1 %
HCT: 43.4 % (ref 36.0–46.0)
Hemoglobin: 14.8 g/dL (ref 12.0–15.0)
Immature Granulocytes: 1 %
Lymphocytes Relative: 18 %
Lymphs Abs: 1.9 10*3/uL (ref 0.7–4.0)
MCH: 30.4 pg (ref 26.0–34.0)
MCHC: 34.1 g/dL (ref 30.0–36.0)
MCV: 89.1 fL (ref 80.0–100.0)
Monocytes Absolute: 0.9 10*3/uL (ref 0.1–1.0)
Monocytes Relative: 8 %
Neutro Abs: 7.5 10*3/uL (ref 1.7–7.7)
Neutrophils Relative %: 71 %
Platelets: 281 10*3/uL (ref 150–400)
RBC: 4.87 MIL/uL (ref 3.87–5.11)
RDW: 12.8 % (ref 11.5–15.5)
WBC: 10.5 10*3/uL (ref 4.0–10.5)
nRBC: 0 % (ref 0.0–0.2)

## 2021-07-18 LAB — PROTEIN AND GLUCOSE, CSF
Glucose, CSF: 72 mg/dL — ABNORMAL HIGH (ref 40–70)
Total  Protein, CSF: 29 mg/dL (ref 15–45)

## 2021-07-18 LAB — RESP PANEL BY RT-PCR (FLU A&B, COVID) ARPGX2
Influenza A by PCR: NEGATIVE
Influenza B by PCR: NEGATIVE
SARS Coronavirus 2 by RT PCR: NEGATIVE

## 2021-07-18 LAB — SEDIMENTATION RATE: Sed Rate: 10 mm/hr (ref 0–22)

## 2021-07-18 MED ORDER — METOCLOPRAMIDE HCL 5 MG/ML IJ SOLN
10.0000 mg | Freq: Once | INTRAMUSCULAR | Status: AC
  Administered 2021-07-18: 10 mg via INTRAVENOUS
  Filled 2021-07-18: qty 2

## 2021-07-18 MED ORDER — DIPHENHYDRAMINE HCL 50 MG/ML IJ SOLN
12.5000 mg | Freq: Once | INTRAMUSCULAR | Status: AC
  Administered 2021-07-18: 12.5 mg via INTRAVENOUS
  Filled 2021-07-18: qty 1

## 2021-07-18 MED ORDER — LIDOCAINE HCL (PF) 1 % IJ SOLN
INTRAMUSCULAR | Status: AC
  Filled 2021-07-18: qty 5

## 2021-07-18 NOTE — Discharge Instructions (Addendum)
You were evaluated in the Emergency Department and after careful evaluation, we did not find any emergent condition requiring admission or further testing in the hospital.  Your exam/testing today was overall reassuring.  Your presentation was potentially concerning for a sudden onset thunderclap headache which can be due to a brain bleed such as subarachnoid hemorrhage.  Your CT scan did not reveal an etiology of your headache.  You underwent a lumbar puncture to further evaluate which essentially ruled out subarachnoid hemorrhage with normal counts of red blood cells.  Recommend continued Tylenol over-the-counter at home 1000 mg every 6 hours (daily maximum of 4000 mg/day) and routine follow-up with neurology in clinic for headaches.  Please return to the Emergency Department if you experience any worsening of your condition.  Thank you for allowing [place] to be a part of your care.

## 2021-07-21 LAB — CSF CULTURE W GRAM STAIN: Culture: NO GROWTH

## 2021-07-22 ENCOUNTER — Ambulatory Visit: Payer: Managed Care, Other (non HMO) | Admitting: Psychiatry

## 2021-07-22 ENCOUNTER — Encounter: Payer: Self-pay | Admitting: Psychiatry

## 2021-07-22 ENCOUNTER — Telehealth: Payer: Self-pay | Admitting: Psychiatry

## 2021-07-22 VITALS — BP 145/97 | HR 108 | Ht 64.0 in | Wt 178.0 lb

## 2021-07-22 DIAGNOSIS — G4453 Primary thunderclap headache: Secondary | ICD-10-CM | POA: Diagnosis not present

## 2021-07-22 MED ORDER — KETOROLAC TROMETHAMINE 10 MG PO TABS: 10.0000 mg | ORAL_TABLET | Freq: Three times a day (TID) | ORAL | 0 refills | Status: AC

## 2021-07-22 NOTE — Progress Notes (Signed)
Referring:  Ernie AvenaLawsing, James, MD [address] [place] [id number]  PCP: Macy MisBriscoe, Kim K, MD  Neurology was asked to evaluate Renee Chambers, a 56 year old female for a chief complaint of headaches.  Our recommendations of care will be communicated by shared medical record.    CC:  headaches  HPI:  Medical co-morbidities: depression, anxiety, HLD  The patient presents for evaluation of thunderclap headache which occurred during intercourse on 07/13/21. Headache was described as excruciating shooting pains across her whole head. Felt like "it was about to explode". Afterwards had a dull persistent headache. 2 days later she had another severe headache which remitted on its own. Then had a third episode a couple of days later. Severe pain lasted up to 3 hours. [facility] Lumbar puncture was normal with no evidence of xanthochromia. IV cocktail with benadryl and Reglan helped reduce the pain temporarily.  Currently she has a constant dull aching and throbbing headache currently. Will have sharp pains in the back of her head if she leans forward.  She reports a history of headaches since she was 56 years old, but these would typically resolve with Excedrin. Did have a similar headache during intercourse previously, but only lasted for a few minutes then resolved.  Headache History: Onset: 07/13/21 Triggers: intercourse, bending forward Aura: no Location: holocephalic Quality/Description: throbbing, sharp, shooting Associated Symptoms:  Photophobia: no  Phonophobia: no  Nausea: yes Vomiting: no Worse with activity?: yes Duration of headaches: constant Red flags:   Thunderclap onset  Headache days per month: 12 Headache free days per month: 18  Current Treatment: Abortive Excedrin  Preventative none  Prior Therapies                                 Excedrin Naproxen Ibuprofen Tylenol Cymbalta 60 mg daily  Headache Risk  Factors: Headache risk factors and/or co-morbidities (+) Neck Pain (+) Obesity  Body mass index is 30.55 kg/m. (-) History of Traumatic Brain Injury and/or Concussion (-) History of Syncope  LABS: CBC    Component Value Date/Time   WBC 10.5 07/17/2021 2345   RBC 4.87 07/17/2021 2345   HGB 14.8 07/17/2021 2345   HCT 43.4 07/17/2021 2345   PLT 281 07/17/2021 2345   MCV 89.1 07/17/2021 2345   MCH 30.4 07/17/2021 2345   MCHC 34.1 07/17/2021 2345   RDW 12.8 07/17/2021 2345   LYMPHSABS 1.9 07/17/2021 2345   MONOABS 0.9 07/17/2021 2345   EOSABS 0.1 07/17/2021 2345   BASOSABS 0.1 07/17/2021 2345   CMP Latest Ref Rng & Units 07/17/2021 11/27/2013 11/22/2013  Glucose 70 - 99 mg/dL 213(Y126(H) 865(H107(H) 91  BUN 6 - 20 mg/dL 13 5(L) 14  Creatinine 0.44 - 1.00 mg/dL 8.460.74 9.620.50 9.520.57  Sodium 135 - 145 mmol/L 135 142 140  Potassium 3.5 - 5.1 mmol/L 3.8 3.6(L) 4.6  Chloride 98 - 111 mmol/L 101 104 103  CO2 22 - 32 mmol/L 25 31 28   Calcium 8.9 - 10.3 mg/dL 8.4(X8.8(L) 3.2(G8.2(L) 9.1  Total Protein 6.0 - 8.3 g/dL - - -  Total Bilirubin 0.3 - 1.2 mg/dL - - -  Alkaline Phos 39 - 117 U/L - - -  AST 0 - 37 U/L - - -  ALT 0 - 35 U/L - - -   CSF 07/18/21: 0 RBC, 1 WBC  IMAGING:  CTH 07/17/21: unremarkable  Imaging independently reviewed on July 22, 2021   Current Outpatient Medications on File Prior to Visit  Medication Sig Dispense Refill   albuterol (VENTOLIN HFA) 108 (90 Base) MCG/ACT inhaler ProAir HFA 90 mcg/actuation aerosol inhaler 18 g 3   atorvastatin (LIPITOR) 20 MG tablet Take 1 tablet by mouth daily.     buPROPion (WELLBUTRIN XL) 150 MG 24 hr tablet Take by mouth.     busPIRone (BUSPAR) 15 MG tablet Take 15 mg by mouth 2 (two) times daily.     Carbinoxamine Maleate 4 MG TABS Take 1 tablet (4 mg total) by mouth every 6 (six) hours as needed. 120 tablet 5   CYCLOBENZAPRINE HCL PO Take by mouth as needed.     DOTTI 0.0375 MG/24HR 1 patch 2 (two) times a week.     DULoxetine (CYMBALTA) 30 MG capsule  duloxetine 30 mg capsule,delayed release     ibuprofen (ADVIL,MOTRIN) 600 MG tablet Take 1 tablet (600 mg total) by mouth every 6 (six) hours as needed (mild pain). 30 tablet 0   montelukast (SINGULAIR) 10 MG tablet Take 1 tablet (10 mg total) by mouth at bedtime. 90 tablet 5   naproxen (NAPROSYN) 500 MG tablet naproxen 500 mg tablet     Olopatadine HCl 0.2 % SOLN Apply to eye.     No current facility-administered medications on file prior to visit.     Allergies: Allergies  Allergen Reactions   Morphine And Related Itching   Morphine And Related Itching   Tape Hives    Family History: Migraine or other headaches in the family:  mother Aneurysms in a first degree relative:  no Brain tumors in the family:  no Other neurological illness in the family:   father had strokes and TIAs  Past Medical History: Past Medical History:  Diagnosis Date   Asthmatic bronchitis 03/04/2019   Medical history non-contributory     Past Surgical History Past Surgical History:  Procedure Laterality Date   APPENDECTOMY     LAPAROSCOPIC ASSISTED VAGINAL HYSTERECTOMY N/A 11/26/2013   Procedure: TOTAL ABDOMINAL HYSTERECTOMY/BILATERAL SALPINGO-OOPHORECTOMY/CYSTOSCOPY WITH STENT PLACEMENT/EXTENSIVE LYSIS OF ADHESIONS;  Surgeon: Oliver Pila, MD;  Location: [facility];  Service: Gynecology;  Laterality: N/A;  2 1/2hrs OR time   OVARIAN CYST REMOVAL      Social History: Social History   Tobacco Use   Smoking status: Never   Smokeless tobacco: Never  Vaping Use   Vaping Use: Never used  Substance Use Topics   Alcohol use: No   Drug use: No    ROS: Negative for fevers, chills. Positive for headaches. All other systems reviewed and negative unless stated otherwise in HPI.   Physical Exam:   Vital Signs: BP (!) 145/97    Pulse (!) 108    Ht 5\' 4"  (1.626 m)    Wt 178 lb (80.7 kg)    LMP 11/22/2013    BMI 30.55 kg/m  GENERAL: well appearing,in no acute distress,alert SKIN:  Color, texture,  turgor normal. No rashes or lesions HEAD:  Normocephalic/atraumatic. CV:  RRR RESP: Normal respiratory effort MSK: +tenderness to palpation over bilateral occiput  NEUROLOGICAL: Mental Status: Alert, oriented to person, place and time,Follows commands Cranial Nerves: PERRL,visual fields intact to confrontation,extraocular movements intact,facial sensation intact,no facial droop or ptosis, no dysarthria Motor: muscle strength 5/5 both upper and lower extremities Reflexes: brisk reflexes throughout Sensation: intact to light touch all 4 extremities Coordination: Finger-to- nose-finger intact bilaterally Gait: normal-based   IMPRESSION: 56 year old female  with a history of migraines, depression, anxiety, and HLD who presents for evaluation of thunderclap headaches. CTH and LP were unremarkable. Will order CTA head to assess for aneurysm or vasospasm. Exam significant for tenderness to palpation over bilateral occiput. If imaging is normal will consider treating for migraine/occipital neuralgia.  PLAN: -Toradol 10 mg TID x5 days to break current headache -CTA head -next steps: consider occipital nerve block, daily preventive if unable to break current headache cycle, consider MRI if severe headaches persist despite treatment  I spent a total of 40 minutes chart reviewing and counseling the patient. Headache education was done. Discussed treatment options including acute medications. Discussed medication side effects, adverse reactions and drug interactions. Written educational materials and patient instructions outlining all of the above were given.  Follow-up: 3 months, or sooner if needed   Ocie Doyne, MD 07/22/2021   12:07 PM

## 2021-07-22 NOTE — Telephone Encounter (Signed)
[employer] order sent to GI, they will obtain the auth and reach out to the patient to schedule.  ?

## 2021-07-22 NOTE — Patient Instructions (Addendum)
CTA head Toradol 3 times a day for 5 days. Please avoid NSAIDS like naproxen or ibuprofen while taking this medication

## 2021-07-23 ENCOUNTER — Telehealth: Payer: Self-pay | Admitting: Psychiatry

## 2021-07-23 ENCOUNTER — Encounter: Payer: Self-pay | Admitting: Psychiatry

## 2021-07-23 NOTE — Telephone Encounter (Signed)
Pt's husband called says the ketorolac (TORADOL) 10 MG tablet is not helping his wife's headaches. Requesting a call back to see what can be done.

## 2021-07-26 NOTE — Telephone Encounter (Signed)
Sent message to pt via my chart on this.

## 2021-07-29 ENCOUNTER — Other Ambulatory Visit: Payer: Self-pay | Admitting: Psychiatry

## 2021-07-29 MED ORDER — METHYLPREDNISOLONE 4 MG PO TBPK: ORAL_TABLET | ORAL | 0 refills | Status: DC

## 2021-07-29 NOTE — Telephone Encounter (Signed)
Has she tried a steroid taper? That would be one option, though she couldn't take that and the Toradol at the same time. Otherwise we could try to schedule her for an occipital nerve block

## 2021-07-29 NOTE — Telephone Encounter (Signed)
I sent in a steroid taper to her pharmacy. Please let her know to stop the Toradol and take this instead.

## 2021-08-05 ENCOUNTER — Other Ambulatory Visit: Payer: Self-pay | Admitting: Psychiatry

## 2021-08-05 MED ORDER — PROPRANOLOL HCL 20 MG PO TABS: 20.0000 mg | ORAL_TABLET | Freq: Two times a day (BID) | ORAL | 3 refills | Status: DC

## 2021-08-17 ENCOUNTER — Other Ambulatory Visit: Payer: Managed Care, Other (non HMO)

## 2021-09-06 ENCOUNTER — Ambulatory Visit
Admission: RE | Admit: 2021-09-06 | Discharge: 2021-09-06 | Disposition: A | Discharge status: Home / Self Care | Payer: Managed Care, Other (non HMO) | Source: Ambulatory Visit | Attending: Psychiatry | Admitting: Psychiatry

## 2021-09-06 ENCOUNTER — Other Ambulatory Visit: Payer: Self-pay

## 2021-09-06 DIAGNOSIS — G4453 Primary thunderclap headache: Secondary | ICD-10-CM

## 2021-09-06 MED ORDER — IOPAMIDOL (ISOVUE-370) INJECTION 76%
75.0000 mL | Freq: Once | INTRAVENOUS | Status: AC | PRN
  Administered 2021-09-06: 75 mL via INTRAVENOUS

## 2021-09-08 ENCOUNTER — Telehealth: Payer: Self-pay

## 2021-09-08 ENCOUNTER — Other Ambulatory Visit: Payer: Self-pay | Admitting: Psychiatry

## 2021-09-08 MED ORDER — VERAPAMIL HCL 40 MG PO TABS: ORAL_TABLET | ORAL | 3 refills | Status: DC

## 2021-09-08 NOTE — Telephone Encounter (Addendum)
Pt called back and we were able to discuss this message.  ?Pt verbalized understanding of results and agreement to plan.  ? ?She denies any new onset of numbness, weakness, visual changes, or severe headaches. She is agreeable to starting verapamil and sts she had d/c propranolol a few days ago. We reviewed the dosage of Verapamil would be: ?verapamil (CALAN) 40 MG tablet    ?Sig: Take 40 mg (1 pill) three times a day for 3 days. Then increase to 40 mg in AM, 40 mg in afternoon, and 80 mg (2 pills) in PM for 3 days. Then increase to 40 mg in AM, 80 mg at noon, and 80 mg in PM for 3 days. Then increase to 80 mg three times a day.   ?Pt is ok with this dosage and with repeating CTA in  months. Pt denied any questions/concerns at the time of the call but will call back if needed.  ?

## 2021-09-08 NOTE — Telephone Encounter (Signed)
Thank you! Glad you were able to get a hold of her

## 2021-09-08 NOTE — Telephone Encounter (Signed)
I called pt. No answer, left a message asking pt to call me back.   

## 2021-09-08 NOTE — Telephone Encounter (Signed)
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 I tried to call her twice but it went to voicemail. Please give her a call to see how her headaches are going and to make sure she hasn't developed any other neurologic symptoms. I sent her this Mychart message as well: ? ?"Your CTA shows mild narrowing in the blood vessels in her brain. This can be seen in people with high cholesterol, but it can also be associated with a condition called RCVS. This is a condition where blood vessels in the brain constrict and spasm, which can cause severe headaches. This typically resolves on its own after 3 months. There is an increased risk of stroke with this condition, so you should go to the ED for evaluation if you develop any new neurologic symptoms such as numbness, weakness, vision changes, or severe headaches. Please avoid decongestants, smoking, and steroids during this time as they can exacerbate RCVS. ? ?I would like to start you on a medication called verapamil which can help reduce blood vessels from having spasms. I will send this to your pharmacy. You can stop propranolol and start verapamil. I will also plan to repeat a CTA in 3 months." ?

## 2021-10-05 ENCOUNTER — Ambulatory Visit: Payer: Managed Care, Other (non HMO) | Admitting: Family Medicine

## 2021-10-21 ENCOUNTER — Ambulatory Visit: Payer: Managed Care, Other (non HMO) | Admitting: Family Medicine

## 2021-10-21 ENCOUNTER — Encounter: Payer: Self-pay | Admitting: Psychiatry

## 2021-10-21 ENCOUNTER — Ambulatory Visit: Payer: Managed Care, Other (non HMO) | Admitting: Psychiatry

## 2021-10-21 ENCOUNTER — Telehealth: Payer: Self-pay | Admitting: Psychiatry

## 2021-10-21 VITALS — BP 150/89 | HR 102 | Ht 64.0 in | Wt 189.0 lb

## 2021-10-21 DIAGNOSIS — G4453 Primary thunderclap headache: Secondary | ICD-10-CM

## 2021-10-21 DIAGNOSIS — I67841 Reversible cerebrovascular vasoconstriction syndrome: Secondary | ICD-10-CM

## 2021-10-21 MED ORDER — INDOMETHACIN 25 MG PO CAPS: ORAL_CAPSULE | ORAL | 3 refills | Status: DC

## 2021-10-21 MED ORDER — GABAPENTIN 300 MG PO CAPS
ORAL_CAPSULE | ORAL | 3 refills | Status: DC
Start: 2021-10-21 — End: 2022-01-25

## 2021-10-21 NOTE — Progress Notes (Signed)
? ?  CC:  headaches ? ?Follow-up Visit ? ?Last visit: 07/22/21 ? ?Brief HPI: ?56 year old female with a history of migraines, kidney stones, depression, anxiety, HLD who follows in clinic for thunderclap headaches. ? ?CTA was ordered at her last visit. She was started on propranolol for headache prevention. ? 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 Propranolol caused headache was switched to verapamil 80 mg TID. She stopped taking verapamil 2 weeks after starting it because she did not notice a difference in her headaches. She currently only has thunderclap headaches during sex. She does have daily lower level headaches which are described as head pressure without photophobia, phonophobia, or nausea. These improve with Excedrin. She is currently taking Excedrin daily. ? ?Headache days per month: 30 ?Headache free days per month: 0 ? ?Current Headache Regimen: ?Preventative: none ?Abortive: Excedrin ? ?Prior Therapies                                  ?Excedrin ?Naproxen ?Ibuprofen ?Tylenol ?Cymbalta 60 mg daily ?Topamax ?Propranolol 20 mg BID - worsened headache ?Verapamil 80 mg TID ? ?Physical Exam:  ? ?Vital Signs: ?LMP 11/22/2013  ?GENERAL:  well appearing, in no acute distress, alert  ?SKIN:  Color, texture, turgor normal. No rashes or lesions ?HEAD:  Normocephalic/atraumatic. ?RESP: normal respiratory effort ?MSK:  No gross joint deformities.  ? ?NEUROLOGICAL: ?Mental Status: Alert, oriented to person, place and time, Follows commands, and Speech fluent and appropriate. ?Cranial Nerves: PERRL, face symmetric, no dysarthria, hearing grossly intact ?Motor: moves all extremities equally ?Gait: normal-based. ? ?IMPRESSION: ?56 year old female with a history of  migraines, kidney stones, depression, anxiety, HLD who presents for follow up of thunderclap headaches. 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 She did not notice improvement on verapamil and self-discontinued it. Will start PRN indomethacin for her sex headaches. Would avoid PRN triptans at this time due to risk of vasospasm. Will start gabapentin for her daily tension-type headaches. Will plan to repeat CTA next month to assess for resolution of vasospasm. ? ?PLAN: ?-Repeat CTA in May 2023 ?-Start indomethacin 25 mg PRN for sex headache. Can take 30 minutes before sex for prevention ?-Start gabapentin 300 mg TID for daily tension type headaches ?-Counseled to go to ED if new neurologic symptoms develop ?-next steps: consider daily indomethacin ? ?Follow-up: 4 months ? ?I spent a total of 30 minutes on the date of the service. Headache education was done. Discussed treatment options including preventive and acute medications. Discussed medication side effects, adverse reactions and drug interactions. Written educational materials and patient instructions outlining all of the above were given. ? ?Ocie Doyne, MD ?10/21/21 ?4:08 PM ? ?

## 2021-10-21 NOTE — Patient Instructions (Signed)
Start gabapentin for headache prevention. Take 300 mg at bedtime for 5 days, then take 300 mg twice a day for 5 days, then take 300 mg three times a day ?Take indomethacin 30 minutes before sex. Can take as needed for sex headache ?Repeat CT scan in May ?

## 2021-10-21 NOTE — Telephone Encounter (Signed)
[employer] order sent to [facility], they will obtain the auth and reach out to the patient to schedule.  ?

## 2021-10-25 ENCOUNTER — Other Ambulatory Visit: Payer: Self-pay | Admitting: Orthopedic Surgery

## 2021-10-25 DIAGNOSIS — M4316 Spondylolisthesis, lumbar region: Secondary | ICD-10-CM

## 2021-10-31 ENCOUNTER — Other Ambulatory Visit: Payer: Managed Care, Other (non HMO)

## 2021-11-01 ENCOUNTER — Other Ambulatory Visit: Payer: Managed Care, Other (non HMO)

## 2021-11-03 ENCOUNTER — Telehealth: Payer: Self-pay | Admitting: Family Medicine

## 2021-11-03 ENCOUNTER — Ambulatory Visit (INDEPENDENT_AMBULATORY_CARE_PROVIDER_SITE_OTHER): Payer: Managed Care, Other (non HMO) | Admitting: Family Medicine

## 2021-11-03 ENCOUNTER — Encounter: Payer: Self-pay | Admitting: Family Medicine

## 2021-11-03 VITALS — BP 132/88 | HR 109 | Ht 64.0 in | Wt 186.6 lb

## 2021-11-03 DIAGNOSIS — Z90710 Acquired absence of both cervix and uterus: Secondary | ICD-10-CM

## 2021-11-03 DIAGNOSIS — J329 Chronic sinusitis, unspecified: Secondary | ICD-10-CM | POA: Diagnosis not present

## 2021-11-03 DIAGNOSIS — Z1231 Encounter for screening mammogram for malignant neoplasm of breast: Secondary | ICD-10-CM

## 2021-11-03 DIAGNOSIS — E8940 Asymptomatic postprocedural ovarian failure: Secondary | ICD-10-CM | POA: Diagnosis not present

## 2021-11-03 DIAGNOSIS — J40 Bronchitis, not specified as acute or chronic: Secondary | ICD-10-CM | POA: Diagnosis not present

## 2021-11-03 MED ORDER — ALBUTEROL SULFATE HFA 108 (90 BASE) MCG/ACT IN AERS: INHALATION_SPRAY | RESPIRATORY_TRACT | 3 refills | Status: DC

## 2021-11-03 MED ORDER — AMOXICILLIN-POT CLAVULANATE 875-125 MG PO TABS: 1.0000 | ORAL_TABLET | Freq: Two times a day (BID) | ORAL | 0 refills | Status: AC

## 2021-11-03 NOTE — Progress Notes (Signed)
? ?New Patient Office Visit ? ?Subjective   ? ?Patient ID: Renee GarrisonCarol Chambers, female    DOB: 09/04/1965  Age: 56 y.o. MRN: [id number] ? ?CC: establish care, URI  ? ?HPI ?Renee Chambers presents to establish care. Recently changed insurance so she needs a new provider.  ? ? ?URI symptoms for the past week: Reports symptoms started as sore throat, nasal congestion, etc now settling into her chest and causing a productive cough with yellow sputum. She has been having some occasional low-grade fevers. She denies any chest pain, dyspnea, wheezing, nausea, vomiting, diarrhea, ear pain, sneezing. Reports her husband had similar symptoms the week prior. States that she often develops bronchitis requiring at least one round of antibiotics.  ? ? ?Asthma/Bronchitis - She does not recall being actually diagnosed with asthma, but did do spirometry years ago but cannot remember results. She reports she doesn't use albuterol often (needs a refill now)- cannot remember the last time she used it. She takes singulair daily which helps significantly.  ? ?Depression/Anxiety - Well controlled on  wellbutrin, buspar, duloxetine. No SI/HI. No medication side effects  ? ?Headaches - Reports thunderclap headaches during sex and daily low-level headaches. She follows with neuro and has recently tried propranolol and verapamil for prevention but did not tolerate. States she is now taking gabapentin 300 mg BID (has not yet titrated up to TID) tolerating well, and she takes indomethacin prior to intercourse for prevention.  ? ?HLD - Currently taking Lipitor 20 mg daily. She tries to watch diet, but admits to not getting much exercise. ? ?Chronic back pain (2017) - She has seen numerous ortho docs(changes with insurance plans), but has had trouble controlling. She most recently saw [facility] in [place]. States she is doing decent on zanaflex and carbinoxamine, NSAIDs PRN. She has tried injections, nerve ablation, etc; CT/MRI is  pending - new provider has suggested surgical intervention. ? ?Menopause - reports she is seeing a hormone specialist - hot flashes, joint pain, diaphoresis; estrogen and testosterone pellets for management - seems to be helping ? ?Constipation - has been getting gradually worse as she has been placed on more meds; takes magnesium oxide daily with mild improvement; admits to not eating much fiber ? ? ? ?Outpatient Encounter Medications as of 11/03/2021  ?Medication Sig  ? amoxicillin-clavulanate (AUGMENTIN) 875-125 MG tablet Take 1 tablet by mouth 2 (two) times daily for 10 days.  ? atorvastatin (LIPITOR) 20 MG tablet Take 1 tablet by mouth daily.  ? buPROPion (WELLBUTRIN XL) 150 MG 24 hr tablet Take by mouth.  ? busPIRone (BUSPAR) 15 MG tablet Take 15 mg by mouth 2 (two) times daily.  ? Carbinoxamine Maleate 4 MG TABS Take 1 tablet (4 mg total) by mouth every 6 (six) hours as needed.  ? DULoxetine (CYMBALTA) 30 MG capsule duloxetine 30 mg capsule,delayed release  ? gabapentin (NEURONTIN) 300 MG capsule Take 300 mg at bedtime for 5 days, then increase to 300 mg twice a day for 5 days, then increase to 300 mg three times a day  ? ibuprofen (ADVIL,MOTRIN) 600 MG tablet Take 1 tablet (600 mg total) by mouth every 6 (six) hours as needed (mild pain).  ? indomethacin (INDOCIN) 25 MG capsule Take 30 minutes before sex, or as needed for sex headache.  ? montelukast (SINGULAIR) 10 MG tablet Take 1 tablet (10 mg total) by mouth at bedtime.  ? naproxen (NAPROSYN) 500 MG tablet naproxen 500 mg tablet  ? Olopatadine HCl 0.2 %  SOLN Apply to eye.  ? tiZANidine (ZANAFLEX) 2 MG tablet Take 2 mg by mouth every 8 (eight) hours as needed.  ? [DISCONTINUED] albuterol (VENTOLIN HFA) 108 (90 Base) MCG/ACT inhaler ProAir HFA 90 mcg/actuation aerosol inhaler  ? albuterol (VENTOLIN HFA) 108 (90 Base) MCG/ACT inhaler ProAir HFA 90 mcg/actuation aerosol inhaler  ? [DISCONTINUED] CYCLOBENZAPRINE HCL PO Take by mouth as needed. (Patient not  taking: Reported on 10/21/2021)  ? [DISCONTINUED] verapamil (CALAN) 40 MG tablet Take 40 mg (1 pill) three times a day for 3 days. Then increase to 40 mg in AM, 40 mg in afternoon, and 80 mg (2 pills) in PM for 3 days. Then increase to 40 mg in AM, 80 mg at noon, and 80 mg in PM for 3 days. Then increase to 80 mg three times a day.  ? ?No facility-administered encounter medications on file as of 11/03/2021.  ? ? ?Past Medical History:  ?Diagnosis Date  ? Anxiety   ? Asthmatic bronchitis 03/04/2019  ? Depression   ? Hyperlipidemia   ? Medical history non-contributory   ? ? ?Past Surgical History:  ?Procedure Laterality Date  ? ABDOMINAL HYSTERECTOMY    ? APPENDECTOMY    ? LAPAROSCOPIC ASSISTED VAGINAL HYSTERECTOMY N/A 11/26/2013  ? Procedure: TOTAL ABDOMINAL HYSTERECTOMY/BILATERAL SALPINGO-OOPHORECTOMY/CYSTOSCOPY WITH STENT PLACEMENT/EXTENSIVE LYSIS OF ADHESIONS;  Surgeon: Oliver Pila, MD;  Location: [facility];  Service: Gynecology;  Laterality: N/A;  2 1/2hrs OR time  ? OVARIAN CYST REMOVAL    ? ? ?Family History  ?Problem Relation Age of Onset  ? Hearing loss Mother   ? Glaucoma Mother   ? Hyperlipidemia Father   ? Heart disease Father   ? Hearing loss Father   ? Diabetes Father   ? Hypertension Father   ? CAD Father   ? Heart attack Father   ? Heart attack Brother   ? Heart disease Brother   ? Early death Brother   ? Depression Brother   ? Heart attack Brother   ? Heart disease Brother   ? Cancer Maternal Grandmother   ? Heart attack Paternal Grandmother   ? Cancer Paternal Grandfather   ? Asthma Neg Hx   ? ? ?Social History  ? ?Socioeconomic History  ? Marital status: Married  ?  Spouse name: Not on file  ? Number of children: Not on file  ? Years of education: Not on file  ? Highest education level: Not on file  ?Occupational History  ? Not on file  ?Tobacco Use  ? Smoking status: Never  ? Smokeless tobacco: Never  ?Vaping Use  ? Vaping Use: Never used  ?Substance and Sexual Activity  ? Alcohol use: No  ? Drug  use: No  ? Sexual activity: Yes  ?  Birth control/protection: None  ?Other Topics Concern  ? Not on file  ?Social History Narrative  ? Not on file  ? ?Social Determinants of Health  ? ?Financial Resource Strain: Not on file  ?Food Insecurity: Not on file  ?Transportation Needs: Not on file  ?Physical Activity: Not on file  ?Stress: Not on file  ?Social Connections: Not on file  ?Intimate Partner Violence: Not on file  ? ? ?ROS ?All review of systems negative except what is listed in the HPI ? ?  ? ? ?Objective   ? ?BP 132/88   Pulse (!) 109   Ht 5\' 4"  (1.626 m)   Wt 186 lb 9.6 oz (84.6 kg)   LMP 11/22/2013  BMI 32.03 kg/m?  ? ?Physical Exam ?Vitals reviewed.  ?Constitutional:   ?   Appearance: Normal appearance.  ?Cardiovascular:  ?   Rate and Rhythm: Normal rate and regular rhythm.  ?Pulmonary:  ?   Effort: Pulmonary effort is normal.  ?   Breath sounds: Normal breath sounds.  ?Neurological:  ?   General: No focal deficit present.  ?   Mental Status: She is alert and oriented to person, place, and time. Mental status is at baseline.  ?Psychiatric:     ?   Mood and Affect: Mood normal.     ?   Behavior: Behavior normal.     ?   Thought Content: Thought content normal.     ?   Judgment: Judgment normal.  ? ? ? ?  ? ?Assessment & Plan:  ? ?1. Sinobronchitis ?For your upper respiratory symptoms: given duration, let's try some Augmentin. You can try to wait another 2 days or so to see if you start improving on your own, but if not, start the antibiotics. Continue supportive measures including rest, hydration, humidifier use, steam showers, warm compresses to sinuses, warm liquids with lemon and honey, and over-the-counter cough, cold, and analgesics as needed.   ?- albuterol (VENTOLIN HFA) 108 (90 Base) MCG/ACT inhaler; ProAir HFA 90 mcg/actuation aerosol inhaler  Dispense: 18 g; Refill: 3 ?- amoxicillin-clavulanate (AUGMENTIN) 875-125 MG tablet; Take 1 tablet by mouth 2 (two) times daily for 10 days.  Dispense:  20 tablet; Refill: 0 ? ?2. Encounter for screening mammogram for malignant neoplasm of breast ?- MM DIGITAL SCREENING BILATERAL; Future ? ?3. Post hysterectomy menopause ?Requesting referral to GYN for general

## 2021-11-03 NOTE — Telephone Encounter (Signed)
CallerElita Quick  ?[id number] ? ?Pharm needs directions on albuterol inhaler for patient. Please advise.  ?

## 2021-11-03 NOTE — Telephone Encounter (Signed)
Pharmacy notified of instructions.

## 2021-11-03 NOTE — Patient Instructions (Addendum)
Constipation - start Benefiber or Metamucil daily; when constipated - capful of Miralax once or twice a day until stools are soft then use as needed; a good softener if needed is Colace. Be sure you are staying well hydrated and try to get activity as tolerated.  ? ?For you upper respiratory symptoms: given duration, let's try some Augmentin. You can try to wait another 2 days or so to see if you start improving on your own, but if not, start the antibiotics. Continue supportive measures including rest, hydration, humidifier use, steam showers, warm compresses to sinuses, warm liquids with lemon and honey, and over-the-counter cough, cold, and analgesics as needed.   ? ? ?Thank you for choosing [facility] at [facility] for your Primary Care needs. I am excited for the opportunity to partner with you to meet your health care goals. It was a pleasure meeting you today! ? ? ? ?Information on diet, exercise, and health maintenance recommendations are listed below. This is information to help you be sure you are on track for optimal health and monitoring.  ? ?Please look over this and let us know if you have any questions or if you have completed any of the health maintenance outside of [facility] so that we can be sure your records are up to date.  ?___________________________________________________________ ? ?MyChart:  ?For all urgent or time sensitive needs we ask that you please call the office to avoid delays. Our number is [phone]. ?MyChart is not constantly monitored and due to the large volume of messages a day, replies may take up to 72 business hours. ? ?MyChart Policy: ?MyChart allows for you to see your visit notes, after visit summary, provider recommendations, lab and tests results, make an appointment, request refills, and contact your provider or the office for non-urgent questions or concerns. Providers are seeing patients during normal business hours and do not have built in  time to review MyChart messages.  ?We ask that you allow a minimum of 3 business days for responses to [employer]. For this reason, please do not send urgent requests through [place]. Please call the office at [phone]. ?New and ongoing conditions may require a visit. We have virtual and in-person visits available for your convenience.  ?Complex MyChart concerns may require a visit. Your provider may request you schedule a virtual or in-person visit to ensure we are providing the best care possible. ?MyChart messages sent after 11:00 AM on Friday will not be received by the provider until Monday morning.  ?  ?Lab and Test Results: ?You will receive your lab and test results on MyChart as soon as they are completed and results have been sent by the lab or testing facility. Due to this service, you will receive your results BEFORE your provider.  ?I review lab and test results each morning prior to seeing patients. Some results require collaboration with other providers to ensure you are receiving the most appropriate care. For this reason, we ask that you please allow a minimum of 3-5 business days from the time that ALL results have been received for your provider to receive and review lab and test results and contact you about these.  ?Most lab and test result comments from the provider will be sent through [facility]. Your provider may recommend changes to the plan of care, follow-up visits, repeat testing, ask questions, or request an office visit to discuss these results. You may reply directly to this message or call the  office to provide information for the provider or set up an appointment. ?In some instances, you will be called with test results and recommendations. Please let us know if this is preferred and we will make note of this in your chart to provide this for you.    ?If you have not heard a response to your lab or test results in 5 business days from all results returning to [facility], please  call the office to let us know. We ask that you please avoid calling prior to this time unless there is an emergent concern. Due to high call volumes, this can delay the resulting process. ? ?After Hours: ?For all non-emergency after hours needs, please call the office at [phone] and select the option to reach the on-call  service. On-call services are shared between multiple [facility] offices and therefore it will not be possible to speak directly with your provider. On-call providers may provide medical advice and recommendations, but are unable to provide refills for maintenance medications.  ?For all emergency or urgent medical needs after normal business hours, we recommend that you seek care at the closest Urgent Care or Emergency Department to ensure appropriate treatment in a timely manner.  ?[facility] at [place] has a 24 hour emergency room located on the ground floor for your convenience.  ? ?Urgent Concerns During the Business Day ?Providers are seeing patients from 8AM to [place] with a busy schedule and are most often not able to respond to non-urgent calls until the end of the day or the next business day. ?If you should have URGENT concerns during the day, please call and speak to the nurse or schedule a same day appointment so that we can address your concern without delay.  ? ?Thank you, again, for choosing me as your health care partner. I appreciate your trust and look forward to learning more about you.  ? ?[employer] N[employer]. Olevia Bowens, DNP, FNP-C ? ?___________________________________________________________ ? ?Health Maintenance Recommendations ?Screening Testing ?Mammogram ?Every 1-2 years based on history and risk factors ?Starting at age 59 ?Pap Smear ?Ages 21-39 every 3 years ?Ages 61-65 every 5 years with HPV testing ?More frequent testing may be required based on results and history ?Colon Cancer Screening ?Every 1-10 years based on test performed, risk factors, and history ?Starting  at age 40 ?Bone Density Screening ?Every 2-10 years based on history ?Starting at age 84 for women ?Recommendations for men differ based on medication usage, history, and risk factors ?AAA Screening ?One time ultrasound ?Men 81-70 years old who have ever smoked ?Lung Cancer Screening ?Low Dose Lung CT every 12 months ?Age 56-80 years with a 20 pack-year smoking history who still smoke or who have quit within the last 15 years ? ?Screening Labs ?Routine  Labs: Complete Blood Count (CBC), Complete Metabolic Panel (CMP), Cholesterol (Lipid Panel) ?Every 6-12 months based on history and medications ?May be recommended more frequently based on current conditions or previous results ?Hemoglobin A1c Lab ?Every 3-12 months based on history and previous results ?Starting at age 71 or earlier with diagnosis of diabetes, high cholesterol, BMI >26, and/or risk factors ?Frequent monitoring for patients with diabetes to ensure blood sugar control ?Thyroid Panel (TSH w/ T3 & T4) ?Every 6 months based on history, symptoms, and risk factors ?May be repeated more often if on medication ?HIV ?One time testing for all patients 63 and older ?May be repeated more frequently for patients with increased risk factors or exposure ?Hepatitis C ?One time testing  for all patients 18 and older ?May be repeated more frequently for patients with increased risk factors or exposure ?Gonorrhea, Chlamydia ?Every 12 months for all sexually active persons 13-24 years ?Additional monitoring may be recommended for those who are considered high risk or who have symptoms ?PSA ?Men 72-67 years old with risk factors ?Additional screening may be recommended from age 85-69 based on risk factors, symptoms, and history ? ?Vaccine Recommendations ?Tetanus Booster ?All adults every 10 years ?Flu Vaccine ?All patients 6 months and older every year ?COVID Vaccine ?All patients 12 years and older ?Initial dosing with booster ?May recommend additional booster based on  age and health history ?HPV Vaccine ?2 doses all patients age 37-26 ?Dosing may be considered for patients over 26 ?Shingles Vaccine (Shingrix) ?2 doses all adults 50 years and older ?Pneumonia (Pneumo

## 2021-11-04 ENCOUNTER — Encounter: Payer: Self-pay | Admitting: Family Medicine

## 2021-11-04 ENCOUNTER — Other Ambulatory Visit: Payer: Self-pay | Admitting: *Deleted

## 2021-11-04 MED ORDER — ATORVASTATIN CALCIUM 20 MG PO TABS: 20.0000 mg | ORAL_TABLET | Freq: Every day | ORAL | 1 refills | Status: DC

## 2021-11-04 MED ORDER — BUPROPION HCL ER (XL) 150 MG PO TB24: 150.0000 mg | ORAL_TABLET | Freq: Every day | ORAL | 1 refills | Status: DC

## 2021-11-04 MED ORDER — BUSPIRONE HCL 15 MG PO TABS
15.0000 mg | ORAL_TABLET | Freq: Two times a day (BID) | ORAL | 1 refills | Status: DC
Start: 2021-11-04 — End: 2022-02-07

## 2021-11-15 ENCOUNTER — Other Ambulatory Visit: Payer: Managed Care, Other (non HMO)

## 2021-11-15 ENCOUNTER — Ambulatory Visit
Admission: RE | Admit: 2021-11-15 | Discharge: 2021-11-15 | Disposition: A | Discharge status: Home / Self Care | Payer: Commercial Managed Care - HMO | Source: Ambulatory Visit | Attending: Orthopedic Surgery | Admitting: Orthopedic Surgery

## 2021-11-15 DIAGNOSIS — M4316 Spondylolisthesis, lumbar region: Secondary | ICD-10-CM

## 2021-11-17 ENCOUNTER — Encounter: Payer: Self-pay | Admitting: Family Medicine

## 2021-11-26 ENCOUNTER — Ambulatory Visit (INDEPENDENT_AMBULATORY_CARE_PROVIDER_SITE_OTHER): Payer: Commercial Managed Care - HMO | Admitting: Family Medicine

## 2021-11-26 ENCOUNTER — Encounter: Payer: Self-pay | Admitting: Family Medicine

## 2021-11-26 ENCOUNTER — Ambulatory Visit
Admission: RE | Admit: 2021-11-26 | Discharge: 2021-11-26 | Disposition: A | Discharge status: Home / Self Care | Payer: Self-pay | Source: Ambulatory Visit | Attending: Psychiatry | Admitting: Psychiatry

## 2021-11-26 ENCOUNTER — Telehealth: Payer: Self-pay | Admitting: Family Medicine

## 2021-11-26 VITALS — BP 110/72 | HR 98 | Resp 18 | Ht 64.0 in | Wt 185.4 lb

## 2021-11-26 DIAGNOSIS — J40 Bronchitis, not specified as acute or chronic: Secondary | ICD-10-CM

## 2021-11-26 DIAGNOSIS — J329 Chronic sinusitis, unspecified: Secondary | ICD-10-CM

## 2021-11-26 DIAGNOSIS — I67841 Reversible cerebrovascular vasoconstriction syndrome: Secondary | ICD-10-CM

## 2021-11-26 MED ORDER — HYDROCOD POLI-CHLORPHE POLI ER 10-8 MG/5ML PO SUER: 5.0000 mL | Freq: Two times a day (BID) | ORAL | 0 refills | Status: DC | PRN

## 2021-11-26 MED ORDER — PREDNISONE 20 MG PO TABS: 20.0000 mg | ORAL_TABLET | Freq: Every day | ORAL | 0 refills | Status: AC

## 2021-11-26 MED ORDER — IOPAMIDOL (ISOVUE-370) INJECTION 76%
75.0000 mL | Freq: Once | INTRAVENOUS | Status: AC | PRN
  Administered 2021-11-26: 75 mL via INTRAVENOUS

## 2021-11-26 MED ORDER — DOXYCYCLINE HYCLATE 100 MG PO TABS
100.0000 mg | ORAL_TABLET | Freq: Two times a day (BID) | ORAL | 0 refills | Status: AC
Start: 2021-11-26 — End: 2021-12-03

## 2021-11-26 MED ORDER — HYDROCODONE BIT-HOMATROP MBR 5-1.5 MG/5ML PO SOLN: 5.0000 mL | Freq: Three times a day (TID) | ORAL | 0 refills | Status: AC | PRN

## 2021-11-26 NOTE — Telephone Encounter (Signed)
Spoke with pharmacist and the cough syrup you sent is only available in 45ml.  Pharmacist said they have smaller quantities of the hydrocodone homotropine 5-1.5.  Whichever you choose will need a new rx sent.

## 2021-11-26 NOTE — Patient Instructions (Signed)
Let's try switching to another antibiotic and adding prednisone this time. Adding some as needed cough syrup.  Continue supportive measures including rest, hydration, humidifier use, steam showers, warm compresses to sinuses, warm liquids with lemon and honey, and over-the-counter cough, cold, and analgesics as needed.   Lungs sound clear today.   Please contact office for follow-up if symptoms do not improve or worsen. Seek emergency care if symptoms become severe.

## 2021-11-26 NOTE — Telephone Encounter (Signed)
[facility] pharmacist called regarding pt's rx for  chlorpheniramine needs to speak with someone for clarification/updates

## 2021-11-26 NOTE — Progress Notes (Signed)
Acute Office Visit  Subjective:     Patient ID: Renee Chambers, female    DOB: 03/03/1966, 56 y.o.   MRN: [id number]  Chief Complaint  Patient presents with   follow up on URI    Pt says she finished the antibiotic and is still having sxs.    HPI Patient is in today for sinusitis/cough.   About 3 weeks ago, patient was treated for sinus infection that had been ongoing for about 2 weeks prior. She was given Augmentin and Albuterol. Reports that she did get some initial relief of the sinus symptoms, but cough never resolved. Within the past week, sinus symptoms/congestion/drainage has returned and cough seems to be worsening and more in her upper chest. She reports the cough is productive, but she hasn't visualized the sputum. She is feeling very fatigued again and not sleeping well because of the cough. She has had some body aches, but no fevers, dyspnea, wheezing, chills, headaches, soret throat, GI/GU symptoms.   She has been taking antihistamines and Mucinex dm.    ROS All review of systems negative except what is listed in the HPI      Objective:    BP 110/72 (BP Location: Left Arm, Patient Position: Sitting, Cuff Size: Normal)   Pulse 98   Resp 18   Ht 5\' 4"  (1.626 m)   Wt 185 lb 6.4 oz (84.1 kg)   LMP 11/22/2013   SpO2 95%   BMI 31.82 kg/m    Physical Exam Vitals reviewed.  Constitutional:      General: She is not in acute distress.    Appearance: Normal appearance. She is obese.     Comments: Appears tired  HENT:     Head: Normocephalic and atraumatic.  Cardiovascular:     Rate and Rhythm: Normal rate and regular rhythm.  Pulmonary:     Effort: Pulmonary effort is normal.     Breath sounds: Normal breath sounds. No wheezing, rhonchi or rales.     Comments: Notable congested cough Skin:    General: Skin is warm and dry.  Neurological:     General: No focal deficit present.     Mental Status: She is alert and oriented to person, place, and time. Mental  status is at baseline.  Psychiatric:        Mood and Affect: Mood normal.        Behavior: Behavior normal.        Thought Content: Thought content normal.        Judgment: Judgment normal.       No results found for any visits on 11/26/21.      Assessment & Plan:   1. Sinobronchitis Let's try switching to another antibiotic and adding prednisone this time. Adding some as needed cough syrup.  Continue supportive measures including rest, hydration, humidifier use, steam showers, warm compresses to sinuses, warm liquids with lemon and honey, and over-the-counter cough, cold, and analgesics as needed.   Lungs sound clear today.  Patient aware of signs/symptoms requiring further/urgent evaluation.   - doxycycline (VIBRA-TABS) 100 MG tablet; Take 1 tablet (100 mg total) by mouth 2 (two) times daily for 7 days.  Dispense: 14 tablet; Refill: 0 - predniSONE (DELTASONE) 20 MG tablet; Take 1 tablet (20 mg total) by mouth daily with breakfast for 5 days.  Dispense: 5 tablet; Refill: 0 - HYDROcodone bit-homatropine (HYCODAN) 5-1.5 MG/5ML syrup; Take 5 mLs by mouth every 8 (eight) hours as needed for up to 5 days  for cough.  Dispense: 75 mL; Refill: 0   Return if symptoms worsen or fail to improve.  Clayborne Dana, NP

## 2021-12-07 ENCOUNTER — Other Ambulatory Visit: Payer: Self-pay | Admitting: Family Medicine

## 2021-12-07 ENCOUNTER — Telehealth: Payer: Self-pay | Admitting: *Deleted

## 2021-12-07 ENCOUNTER — Inpatient Hospital Stay (HOSPITAL_BASED_OUTPATIENT_CLINIC_OR_DEPARTMENT_OTHER): Admission: RE | Admit: 2021-12-07 | Payer: Managed Care, Other (non HMO) | Source: Ambulatory Visit

## 2021-12-07 NOTE — Telephone Encounter (Signed)
Left message on machine to call the radiology office to cancel.

## 2021-12-07 NOTE — Telephone Encounter (Signed)
Caller Name Kamariyah Timberlake Caller Phone Number [phone] Patient Name Emireth Cockerham Patient DOB 07-10-1966 Call Type Message Only Information Provided Reason for Call Request to [facility] Appointment Initial Comment Caller states she has an appointment for a mammogram tomorrow that caller needs to cancel. Caller will call back to reschedule. Caller sees Hyman Hopes , NP - not listed but address verified. Patient request to speak to RN No Additional Comment Caller needs to CANCEL her mammogram appointment for 12/07/2021 at 1100am. Caller has to leave town. Disp. Time Disposition Final User 12/06/2021 2:26:47 PM General Information Provided Yes [place]

## 2021-12-13 ENCOUNTER — Encounter: Payer: Self-pay | Admitting: Family Medicine

## 2021-12-14 ENCOUNTER — Other Ambulatory Visit: Payer: Self-pay | Admitting: *Deleted

## 2021-12-14 ENCOUNTER — Ambulatory Visit (HOSPITAL_BASED_OUTPATIENT_CLINIC_OR_DEPARTMENT_OTHER): Payer: Self-pay

## 2021-12-14 MED ORDER — BUPROPION HCL ER (XL) 300 MG PO TB24: 300.0000 mg | ORAL_TABLET | Freq: Every day | ORAL | 1 refills | Status: DC

## 2021-12-22 ENCOUNTER — Encounter (HOSPITAL_BASED_OUTPATIENT_CLINIC_OR_DEPARTMENT_OTHER): Payer: Self-pay

## 2021-12-22 ENCOUNTER — Ambulatory Visit (HOSPITAL_BASED_OUTPATIENT_CLINIC_OR_DEPARTMENT_OTHER)
Admission: RE | Admit: 2021-12-22 | Discharge: 2021-12-22 | Disposition: A | Discharge status: Home / Self Care | Payer: Commercial Managed Care - HMO | Source: Ambulatory Visit | Attending: Family Medicine | Admitting: Family Medicine

## 2021-12-22 DIAGNOSIS — Z1231 Encounter for screening mammogram for malignant neoplasm of breast: Secondary | ICD-10-CM | POA: Diagnosis present

## 2022-01-09 ENCOUNTER — Other Ambulatory Visit: Payer: Self-pay | Admitting: Family Medicine

## 2022-01-13 ENCOUNTER — Other Ambulatory Visit: Payer: Self-pay | Admitting: Family Medicine

## 2022-01-25 ENCOUNTER — Ambulatory Visit (INDEPENDENT_AMBULATORY_CARE_PROVIDER_SITE_OTHER): Payer: Commercial Managed Care - HMO | Admitting: Family Medicine

## 2022-01-25 ENCOUNTER — Encounter: Payer: Self-pay | Admitting: Family Medicine

## 2022-01-25 VITALS — BP 143/76 | HR 102 | Ht 64.0 in | Wt 183.8 lb

## 2022-01-25 DIAGNOSIS — F32A Depression, unspecified: Secondary | ICD-10-CM

## 2022-01-25 DIAGNOSIS — R739 Hyperglycemia, unspecified: Secondary | ICD-10-CM

## 2022-01-25 DIAGNOSIS — R030 Elevated blood-pressure reading, without diagnosis of hypertension: Secondary | ICD-10-CM | POA: Diagnosis not present

## 2022-01-25 DIAGNOSIS — E782 Mixed hyperlipidemia: Secondary | ICD-10-CM | POA: Diagnosis not present

## 2022-01-25 DIAGNOSIS — F419 Anxiety disorder, unspecified: Secondary | ICD-10-CM

## 2022-01-25 DIAGNOSIS — Z0001 Encounter for general adult medical examination with abnormal findings: Secondary | ICD-10-CM

## 2022-01-25 LAB — COMPREHENSIVE METABOLIC PANEL
ALT: 15 U/L (ref 0–35)
AST: 14 U/L (ref 0–37)
Albumin: 4.5 g/dL (ref 3.5–5.2)
Alkaline Phosphatase: 72 U/L (ref 39–117)
BUN: 12 mg/dL (ref 6–23)
CO2: 29 mEq/L (ref 19–32)
Calcium: 9.1 mg/dL (ref 8.4–10.5)
Chloride: 102 mEq/L (ref 96–112)
Creatinine, Ser: 0.67 mg/dL (ref 0.40–1.20)
GFR: 98.9 mL/min (ref >60.00)
Glucose, Bld: 114 mg/dL — ABNORMAL HIGH (ref 70–99)
Potassium: 4.3 mEq/L (ref 3.5–5.1)
Sodium: 139 mEq/L (ref 135–145)
Total Bilirubin: 0.8 mg/dL (ref 0.2–1.2)
Total Protein: 6.6 g/dL (ref 6.0–8.3)

## 2022-01-25 LAB — CBC
HCT: 44.4 % (ref 36.0–46.0)
Hemoglobin: 15 g/dL (ref 12.0–15.0)
MCHC: 33.7 g/dL (ref 30.0–36.0)
MCV: 90.7 fl (ref 78.0–100.0)
Platelets: 294 10*3/uL (ref 150.0–400.0)
RBC: 4.9 Mil/uL (ref 3.87–5.11)
RDW: 13.5 % (ref 11.5–15.5)
WBC: 8.8 10*3/uL (ref 4.0–10.5)

## 2022-01-25 LAB — LIPID PANEL
Cholesterol: 183 mg/dL (ref 0–200)
HDL: 46.4 mg/dL (ref >39.00)
LDL Cholesterol: 99 mg/dL (ref 0–99)
NonHDL: 137.06
Total CHOL/HDL Ratio: 4
Triglycerides: 188 mg/dL — ABNORMAL HIGH (ref 0.0–149.0)
VLDL: 37.6 mg/dL (ref 0.0–40.0)

## 2022-01-25 LAB — TSH: TSH: 2.73 u[IU]/mL (ref 0.35–5.50)

## 2022-01-25 LAB — HEMOGLOBIN A1C: Hgb A1c MFr Bld: 5.7 % (ref 4.6–6.5)

## 2022-01-25 MED ORDER — DULOXETINE HCL 30 MG PO CPEP: 30.0000 mg | ORAL_CAPSULE | Freq: Two times a day (BID) | ORAL | 3 refills | Status: DC

## 2022-01-25 NOTE — Progress Notes (Signed)
Complete physical exam  Patient: Renee Chambers   DOB: June 08, 1966   56 y.o. Female  MRN: [id number]  Subjective:    CC: CPE   Renee Chambers is a 56 y.o. female who presents today for a complete physical exam. She reports consuming a general diet. The patient does not participate in regular exercise at present. She generally feels well. She reports sleeping well. She does not have additional problems to discuss today.    Most recent fall risk assessment:    01/25/2022   10:27 AM  Fall Risk   Falls in the past year? 0  Number falls in past yr: 0  Injury with Fall? 0  Risk for fall due to : No Fall Risks  Follow up Falls evaluation completed     Most recent depression screenings:    01/25/2022   10:57 AM  PHQ 2/9 Scores  PHQ - 2 Score 4  PHQ- 9 Score 9    She has had some recent life stressors - elderly parents live 2 hours away and she is over there care. Lots of stressors. Daughter recently moved back in with her and not making wise choices.    Vision:Not within last year , Dental: No current dental problems and Receives regular dental care, and STD: no concerns    Patient Care Team: Clayborne Dana, NP as PCP - General (Family Medicine)   Outpatient Medications Prior to Visit  Medication Sig   albuterol (VENTOLIN HFA) 108 (90 Base) MCG/ACT inhaler ProAir HFA 90 mcg/actuation aerosol inhaler   atorvastatin (LIPITOR) 20 MG tablet Take 1 tablet by mouth once daily   buPROPion (WELLBUTRIN XL) 300 MG 24 hr tablet Take 1 tablet (300 mg total) by mouth daily.   busPIRone (BUSPAR) 15 MG tablet Take 1 tablet (15 mg total) by mouth 2 (two) times daily.   Carbinoxamine Maleate 4 MG TABS Take 1 tablet (4 mg total) by mouth every 6 (six) hours as needed.   montelukast (SINGULAIR) 10 MG tablet TAKE 1 TABLET BY MOUTH AT BEDTIME   tiZANidine (ZANAFLEX) 2 MG tablet Take 2 mg by mouth every 8 (eight) hours as needed.   [DISCONTINUED] DULoxetine (CYMBALTA) 30 MG capsule  duloxetine 30 mg capsule,delayed release   [DISCONTINUED] gabapentin (NEURONTIN) 300 MG capsule Take 300 mg at bedtime for 5 days, then increase to 300 mg twice a day for 5 days, then increase to 300 mg three times a day   No facility-administered medications prior to visit.    ROS All review of systems negative except what is listed in the HPI        Objective:     BP (!) 143/76   Pulse (!) 102   Ht 5\' 4"  (1.626 m)   Wt 183 lb 12.8 oz (83.4 kg)   LMP 11/22/2013   BMI 31.55 kg/m    Physical Exam Vitals reviewed.  Constitutional:      General: She is not in acute distress.    Appearance: Normal appearance. She is obese. She is not ill-appearing.  HENT:     Head: Normocephalic and atraumatic.     Right Ear: Tympanic membrane normal.     Left Ear: Tympanic membrane normal.     Nose: Nose normal.     Mouth/Throat:     Mouth: Mucous membranes are moist.     Pharynx: Oropharynx is clear.  Eyes:     Extraocular Movements: Extraocular movements intact.     Conjunctiva/sclera: Conjunctivae normal.  Pupils: Pupils are equal, round, and reactive to light.  Cardiovascular:     Rate and Rhythm: Normal rate and regular rhythm.     Pulses: Normal pulses.     Heart sounds: Normal heart sounds.  Pulmonary:     Effort: Pulmonary effort is normal.     Breath sounds: Normal breath sounds.  Abdominal:     General: Abdomen is flat. Bowel sounds are normal. There is no distension.     Palpations: Abdomen is soft. There is no mass.     Tenderness: There is no abdominal tenderness. There is no guarding or rebound.  Genitourinary:    Comments: Deferred exam Musculoskeletal:        General: Normal range of motion.     Cervical back: Normal range of motion and neck supple.     Right lower leg: No edema.     Left lower leg: No edema.  Skin:    General: Skin is warm and dry.  Neurological:     General: No focal deficit present.     Mental Status: She is alert and oriented to  person, place, and time. Mental status is at baseline.  Psychiatric:        Mood and Affect: Mood normal.        Behavior: Behavior normal.        Thought Content: Thought content normal.        Judgment: Judgment normal.        No results found for any visits on 01/25/22.     Assessment & Plan:    Routine Health Maintenance and Physical Exam  Immunization History  Administered Date(s) Administered   Influenza,inj,Quad PF,6+ Mos 08/07/2013    Health Maintenance  Topic Date Due   HIV Screening  Never done   Hepatitis C Screening  Never done   TETANUS/TDAP  Never done   PAP SMEAR-Modifier  Never done   COLONOSCOPY (Pts 45-1yrs Insurance coverage will need to be confirmed)  Never done   Zoster Vaccines- Shingrix (1 of 2) Never done   COVID-19 Vaccine (3 - Pfizer series) 01/16/2020   INFLUENZA VACCINE  02/08/2022   MAMMOGRAM  12/23/2023   HPV VACCINES  Aged Out    Discussed health benefits of physical activity, and encouraged her to engage in regular exercise appropriate for her age and condition.  Problem List Items Addressed This Visit       Other   Depression    No SI/HI. Several life stressors triggering her worsening depression/anxiety right now. Will try increasing Cymbalta to see if she gets any relief. She will consider counseling and let me know if she would like a referral.       Relevant Medications   DULoxetine (CYMBALTA) 30 MG capsule   Mixed hyperlipidemia Updating labs today. Discussed healthy diet and exercise.    Relevant Orders   Hemoglobin A1c   CBC   Comprehensive metabolic panel   Lipid panel   TSH   Other Visit Diagnoses     Encounter for routine adult health examination with abnormal findings    -  Primary   Relevant Orders   Hemoglobin A1c   CBC   Comprehensive metabolic panel   Lipid panel   TSH   Elevated blood pressure reading     Possibly related to drinking coffee right before appointment. Recommend monitoring at home.  Discussed lifestyle measures. Recheck here in 2 weeks.    Relevant Orders   Hemoglobin A1c   CBC  Comprehensive metabolic panel   Lipid panel   TSH   Anxiety       Relevant Medications   DULoxetine (CYMBALTA) 30 MG capsule   Hyperglycemia       Relevant Orders   Hemoglobin A1c   Comprehensive metabolic panel      Return in about 2 weeks (around 02/08/2022) for BP check; 4-6 weeks mood f/u .     Clayborne Dana, NP

## 2022-01-25 NOTE — Progress Notes (Signed)
Labs look good overall! Triglycerides are mildly elevated, but this could be higher since you were not fasting.  Your A1c is just barely in the prediabetic range. Recommend focusing on lifestyle measures and rechecking this in 6 months or so: Diet - low carb/sugar intake, portion control Regular exercise - recommend at least 30 minutes a day, 5 times per week Weight management

## 2022-01-25 NOTE — Assessment & Plan Note (Signed)
No SI/HI. Several life stressors triggering her worsening depression/anxiety right now. Will try increasing Cymbalta to see if she gets any relief. She will consider counseling and let me know if she would like a referral.

## 2022-01-28 ENCOUNTER — Encounter: Payer: Commercial Managed Care - HMO | Admitting: Obstetrics & Gynecology

## 2022-02-01 ENCOUNTER — Other Ambulatory Visit: Payer: Self-pay | Admitting: Family Medicine

## 2022-02-02 ENCOUNTER — Encounter: Payer: Self-pay | Admitting: Family Medicine

## 2022-02-02 ENCOUNTER — Ambulatory Visit: Payer: Commercial Managed Care - HMO | Admitting: Family Medicine

## 2022-02-02 VITALS — BP 126/80 | HR 100 | Temp 98.8°F | Resp 22 | Ht 64.5 in | Wt 182.0 lb

## 2022-02-02 DIAGNOSIS — J3089 Other allergic rhinitis: Secondary | ICD-10-CM | POA: Diagnosis not present

## 2022-02-02 DIAGNOSIS — J302 Other seasonal allergic rhinitis: Secondary | ICD-10-CM

## 2022-02-02 DIAGNOSIS — J4520 Mild intermittent asthma, uncomplicated: Secondary | ICD-10-CM | POA: Insufficient documentation

## 2022-02-02 DIAGNOSIS — H1013 Acute atopic conjunctivitis, bilateral: Secondary | ICD-10-CM | POA: Diagnosis not present

## 2022-02-02 DIAGNOSIS — R442 Other hallucinations: Secondary | ICD-10-CM

## 2022-02-02 MED ORDER — ALBUTEROL SULFATE HFA 108 (90 BASE) MCG/ACT IN AERS
2.0000 | INHALATION_SPRAY | RESPIRATORY_TRACT | 2 refills | Status: DC | PRN
Start: 2022-02-02 — End: 2022-10-24

## 2022-02-02 MED ORDER — CARBINOXAMINE MALEATE 4 MG PO TABS
4.0000 mg | ORAL_TABLET | Freq: Four times a day (QID) | ORAL | 5 refills | Status: DC | PRN
Start: 2022-02-02 — End: 2022-10-24

## 2022-02-02 MED ORDER — MONTELUKAST SODIUM 10 MG PO TABS: 10.0000 mg | ORAL_TABLET | Freq: Every day | ORAL | 5 refills | Status: DC

## 2022-02-02 NOTE — Progress Notes (Signed)
[address] [place] [address] Dept: [phone]  FOLLOW UP NOTE  Patient ID: Renee Chambers, female    DOB: [birth year]  Age: 56 y.o. MRN: [id number] Date of Office Visit: 02/02/2022  Assessment  Chief Complaint: Asthma (Doing well)  HPI Renee Chambers is a 56 year old female who presents to the clinic for follow-up visit.  She was last seen in this clinic on 06/16/2021 for evaluation of asthma, allergic rhinitis, allergic conjunctivitis, and phantosmia.  At today's visit, she reports her asthma has been well controlled with 1 bout of bronchitis for which she used her albuterol to help alleviate cough.  Otherwise, she denies shortness of breath, cough, or wheeze with activity or rest.  She continues montelukast 10 mg once a day and rarely uses albuterol.  Allergic rhinitis is reported as moderately well controlled with symptoms including nasal congestion occurring mainly in the morning and postnasal drainage occurring mainly in the fall and spring seasons.  She continues carbinoxamine 4 mg once a day and occasionally takes an additional carbinoxamine 4 mg tablet as needed for symptom control.  She is not currently using Flonase, Qnasl, azelastine, or saline nasal rinses. Her last environmental allergy skin prick testing was on 01/05/2016 and was positive to tree pollen, weed pollen, cockroach, and dust mite. Allergic conjunctivitis is reported as moderately well controlled with symptoms including intermittent thick, clear discharge.  She continues frequent use of Systane and occasional use of olopatadine.  She reports that she continues to experience intermittent hordeola which resolve with the exception of one in her left lower lid. She continues to follow up with her opthamologist for evaluation and treatment of hordeola. She denies phantosmia. Her current medications are listed in the chart.   Drug Allergies:  Allergies  Allergen Reactions   Morphine And Related Itching   Morphine  And Related Itching   Tape Hives    Physical Exam: BP 126/80   Pulse 100   Temp 98.8 F (37.1 C) (Temporal)   Resp (!) 22   Ht 5' 4.5" (1.638 m)   Wt 182 lb (82.6 kg)   LMP 11/22/2013   SpO2 96%   BMI 30.76 kg/m    Physical Exam Vitals reviewed.  Constitutional:      Appearance: Normal appearance.  HENT:     Head: Normocephalic and atraumatic.     Right Ear: Tympanic membrane normal.     Left Ear: Tympanic membrane normal.     Nose:     Comments: Bilateral nares slightly erythematous with clear nasal drainage noted.  Pharynx erythematous with no exudate.  Ears normal.  Eyes normal. Eyes:     Conjunctiva/sclera: Conjunctivae normal.  Cardiovascular:     Rate and Rhythm: Normal rate and regular rhythm.     Heart sounds: Normal heart sounds. No murmur heard. Pulmonary:     Effort: Pulmonary effort is normal.     Breath sounds: Normal breath sounds.     Comments: Lungs clear to auscultation Musculoskeletal:        General: Normal range of motion.     Cervical back: Normal range of motion and neck supple.  Skin:    General: Skin is warm and dry.  Neurological:     Mental Status: She is alert and oriented to person, place, and time.  Psychiatric:        Mood and Affect: Mood normal.        Behavior: Behavior normal.        Thought Content:  Thought content normal.        Judgment: Judgment normal.     Assessment and Plan: 1. Mild intermittent asthma without complication   2. Seasonal and perennial allergic rhinitis   3. Allergic conjunctivitis of both eyes   4. Phantosmia     Meds ordered this encounter  Medications   montelukast (SINGULAIR) 10 MG tablet    Sig: Take 1 tablet (10 mg total) by mouth at bedtime.    Dispense:  30 tablet    Refill:  5   Carbinoxamine Maleate 4 MG TABS    Sig: Take 1 tablet (4 mg total) by mouth every 6 (six) hours as needed.    Dispense:  120 tablet    Refill:  5   albuterol (VENTOLIN HFA) 108 (90 Base) MCG/ACT inhaler     Sig: Inhale 2 puffs into the lungs every 4 (four) hours as needed for wheezing or shortness of breath.    Dispense:  8 g    Refill:  2    Patient Instructions  Asthma Continue montelukast 10 mg once a day to prevent cough or wheeze Continue albuterol 2 puffs once every 4 hours as needed for cough or wheeze You may use albuterol 2 puffs 5 to 15 minutes before activity to decrease cough or wheeze  Allergic rhinitis Continue carbinoxamine to 4 mg- take one tablet every 6 hours as needed for post nasal drainage Continue Flonase 2 sprays in each nostril once a day as needed for a stuffy nose. In the right nostril, point the applicator out toward the right ear. In the left nostril, point the applicator out toward the left ear Consider saline nasal rinses as needed for nasal symptoms. Use this before any medicated nasal sprays for best result  Allergic conjunctivitis Continue Pataday one drop in each eye once a day as needed for red, itchy eyes Continue a lubricating eye drop as needed  Phantosmia Stable If your symptoms re-occur, begin a journal of events that occurred for up to 6 hours before your symptoms began including foods and beverages consumed, soaps or perfumes you had contact with, and medications.    Call the clinic if this treatment plan is not working well for you  Follow up in 6 months or sooner if needed.   Return in about 6 months (around 08/05/2022), or if symptoms worsen or fail to improve.    Thank you for the opportunity to care for this patient.  Please do not hesitate to contact me with questions.  Thermon Leyland, FNP [facility]

## 2022-02-02 NOTE — Patient Instructions (Addendum)
Asthma Continue montelukast 10 mg once a day to prevent cough or wheeze Continue albuterol 2 puffs once every 4 hours as needed for cough or wheeze You may use albuterol 2 puffs 5 to 15 minutes before activity to decrease cough or wheeze  Allergic rhinitis Continue carbinoxamine to 4 mg- take one tablet every 6 hours as needed for post nasal drainage Continue Flonase 2 sprays in each nostril once a day as needed for a stuffy nose. In the right nostril, point the applicator out toward the right ear. In the left nostril, point the applicator out toward the left ear Consider saline nasal rinses as needed for nasal symptoms. Use this before any medicated nasal sprays for best result  Allergic conjunctivitis Continue Pataday one drop in each eye once a day as needed for red, itchy eyes Continue a lubricating eye drop as needed  Phantosmia Stable If your symptoms re-occur, begin a journal of events that occurred for up to 6 hours before your symptoms began including foods and beverages consumed, soaps or perfumes you had contact with, and medications.    Call the clinic if this treatment plan is not working well for you  Follow up in 6 months or sooner if needed.  Reducing Pollen Exposure The American Academy of Allergy, Asthma and Immunology suggests the following steps to reduce your exposure to pollen during allergy seasons. Do not hang sheets or clothing out to dry; pollen may collect on these items. Do not mow lawns or spend time around freshly cut grass; mowing stirs up pollen. Keep windows closed at night.  Keep car windows closed while driving. Minimize morning activities outdoors, a time when pollen counts are usually at their highest. Stay indoors as much as possible when pollen counts or humidity is high and on windy days when pollen tends to remain in the air longer. Use air conditioning when possible.  Many air conditioners have filters that trap the pollen spores. Use a HEPA  room air filter to remove pollen form the indoor air you breathe.  Control of Cockroach Allergen Cockroach allergen has been identified as an important cause of acute attacks of asthma, especially in urban settings.  There are fifty-five species of cockroach that exist in the [place], however only three, the [place], [place] species produce allergen that can affect patients with Asthma.  Allergens can be obtained from fecal particles, egg casings and secretions from cockroaches.    Remove food sources. Reduce access to water. Seal access and entry points. Spray runways with 0.5-1% Diazinon or Chlorpyrifos Blow boric acid power under stoves and refrigerator. Place bait stations (hydramethylnon) at feeding sites.    Control of Dust Mite Allergen Dust mites play a major role in allergic asthma and rhinitis. They occur in environments with high humidity wherever human skin is found. Dust mites absorb humidity from the atmosphere (ie, they do not drink) and feed on organic matter (including shed human and animal skin). Dust mites are a microscopic type of insect that you cannot see with the naked eye. High levels of dust mites have been detected from mattresses, pillows, carpets, upholstered furniture, bed covers, clothes, soft toys and any woven material. The principal allergen of the dust mite is found in its feces. A gram of dust may contain 1,000 mites and 250,000 fecal particles. Mite antigen is easily measured in the air during house cleaning activities. Dust mites do not bite and do not cause harm to humans, other than by triggering  allergies/asthma.  Ways to decrease your exposure to dust mites in your home:  1. Encase mattresses, box springs and pillows with a mite-impermeable barrier or cover  2. Wash sheets, blankets and drapes weekly in hot water (130 F) with detergent and dry them in a dryer on the hot setting.  3. Have the room cleaned frequently with a vacuum  cleaner and a damp dust-mop. For carpeting or rugs, vacuuming with a vacuum cleaner equipped with a high-efficiency particulate air (HEPA) filter. The dust mite allergic individual should not be in a room which is being cleaned and should wait 1 hour after cleaning before going into the room.  4. Do not sleep on upholstered furniture (eg, couches).  5. If possible removing carpeting, upholstered furniture and drapery from the home is ideal. Horizontal blinds should be eliminated in the rooms where the person spends the most time (bedroom, study, television room). Washable vinyl, roller-type shades are optimal.  6. Remove all non-washable stuffed toys from the bedroom. Wash stuffed toys weekly like sheets and blankets above.  7. Reduce indoor humidity to less than 50%. Inexpensive humidity monitors can be purchased at most hardware stores. Do not use a humidifier as can make the problem worse and are not recommended.

## 2022-02-04 ENCOUNTER — Other Ambulatory Visit: Payer: Self-pay | Admitting: Family Medicine

## 2022-02-08 ENCOUNTER — Ambulatory Visit (INDEPENDENT_AMBULATORY_CARE_PROVIDER_SITE_OTHER): Payer: Commercial Managed Care - HMO

## 2022-02-08 DIAGNOSIS — R030 Elevated blood-pressure reading, without diagnosis of hypertension: Secondary | ICD-10-CM | POA: Diagnosis not present

## 2022-02-08 NOTE — Progress Notes (Signed)
Pt here for Blood pressure check per   Most Recent Value 02/02/2022  1509 01/25/2022  1048 01/25/2022  1026  BP: 126/80  as of 02/02/2022 126/80 143/76 Abnormal  145/59 Abnormal     Pt currently takes: Not taking BP Medication   Pt reports compliance with medication.  BP today @ =140/80 right  Arm left 136/82 HR =110  Pt advised per : PT going send in BP reading to Mychart in 2 weeks

## 2022-02-16 ENCOUNTER — Other Ambulatory Visit: Payer: Self-pay | Admitting: Family Medicine

## 2022-03-01 ENCOUNTER — Telehealth: Payer: Self-pay | Admitting: Family

## 2022-03-01 ENCOUNTER — Telehealth: Payer: Self-pay | Admitting: Family Medicine

## 2022-03-01 ENCOUNTER — Other Ambulatory Visit: Payer: Self-pay

## 2022-03-01 ENCOUNTER — Ambulatory Visit (INDEPENDENT_AMBULATORY_CARE_PROVIDER_SITE_OTHER): Payer: Commercial Managed Care - HMO | Admitting: Family

## 2022-03-01 VITALS — BP 132/82 | HR 96 | Temp 98.4°F | Resp 16 | Wt 182.0 lb

## 2022-03-01 DIAGNOSIS — F419 Anxiety disorder, unspecified: Secondary | ICD-10-CM

## 2022-03-01 DIAGNOSIS — F32A Depression, unspecified: Secondary | ICD-10-CM

## 2022-03-01 DIAGNOSIS — R030 Elevated blood-pressure reading, without diagnosis of hypertension: Secondary | ICD-10-CM

## 2022-03-01 MED ORDER — DULOXETINE HCL 30 MG PO CPEP: 90.0000 mg | ORAL_CAPSULE | Freq: Every day | ORAL | 3 refills | Status: DC

## 2022-03-01 MED ORDER — DULOXETINE HCL 30 MG PO CPEP: 90.0000 mg | ORAL_CAPSULE | Freq: Two times a day (BID) | ORAL | 3 refills | Status: DC

## 2022-03-01 NOTE — Assessment & Plan Note (Signed)
BP WNL today. Continue to monitor.

## 2022-03-01 NOTE — Telephone Encounter (Signed)
Please request colonoscopy from [facility].

## 2022-03-01 NOTE — Assessment & Plan Note (Addendum)
Improved on increased dose of cymbalta along with wellbutrin. Continue same. Follow up with PCP in 3 months.

## 2022-03-01 NOTE — Assessment & Plan Note (Signed)
Improved on increased dose of cymbalta.  Continue same.

## 2022-03-01 NOTE — Telephone Encounter (Signed)
Rx sent with new directions.

## 2022-03-01 NOTE — Progress Notes (Signed)
Subjective:     Patient ID: Renee Chambers, female    DOB: October 10, 1965, 56 y.o.   MRN: [id number]  Chief Complaint  Patient presents with   Hypertension    Follow up on elevated BP reading at last appointment    Depression    Here for follow up    Hypertension  Depression         Patient is in today for follow up.  HTN- bp was mildly elevated last visit.  BP Readings from Last 3 Encounters:  03/01/22 132/82  02/08/22 (!) 140/80  02/02/22 126/80   Anxiety/depression- Cymbalta was increased last visit from 60 mg to 90mg .  She notes some improvement in her mood.  Life stressors are unchanged, but she seems to be handling things better.    Health Maintenance Due  Topic Date Due   HIV Screening  Never done   Hepatitis C Screening  Never done   TETANUS/TDAP  Never done   PAP SMEAR-Modifier  Never done   COLONOSCOPY (Pts 45-65yrs Insurance coverage will need to be confirmed)  Never done   Zoster Vaccines- Shingrix (1 of 2) Never done   COVID-19 Vaccine (3 - Pfizer series) 01/16/2020   INFLUENZA VACCINE  02/08/2022    Past Medical History:  Diagnosis Date   Anxiety    Asthmatic bronchitis 03/04/2019   Depression    Hyperlipidemia    Medical history non-contributory     Past Surgical History:  Procedure Laterality Date   ABDOMINAL HYSTERECTOMY     APPENDECTOMY     LAPAROSCOPIC ASSISTED VAGINAL HYSTERECTOMY N/A 11/26/2013   Procedure: TOTAL ABDOMINAL HYSTERECTOMY/BILATERAL SALPINGO-OOPHORECTOMY/CYSTOSCOPY WITH STENT PLACEMENT/EXTENSIVE LYSIS OF ADHESIONS;  Surgeon: 11/28/2013, MD;  Location: [facility];  Service: Gynecology;  Laterality: N/A;  2 1/2hrs OR time   OVARIAN CYST REMOVAL      Family History  Problem Relation Age of Onset   Hearing loss Mother    Glaucoma Mother    Hyperlipidemia Father    Heart disease Father    Hearing loss Father    Diabetes Father    Hypertension Father    CAD Father    Heart attack Father    Heart attack Brother     Heart disease Brother    Early death Brother    Depression Brother    Heart attack Brother    Heart disease Brother    Cancer Maternal Grandmother    Heart attack Paternal Grandmother    Cancer Paternal Grandfather    Asthma Neg Hx     Social History   Socioeconomic History   Marital status: Married    Spouse name: Not on file   Number of children: Not on file   Years of education: Not on file   Highest education level: Not on file  Occupational History   Not on file  Tobacco Use   Smoking status: Never   Smokeless tobacco: Never  Vaping Use   Vaping Use: Never used  Substance and Sexual Activity   Alcohol use: No   Drug use: No   Sexual activity: Yes    Birth control/protection: None  Other Topics Concern   Not on file  Social History Narrative   Not on file   Social Determinants of Health   Financial Resource Strain: Not on file  Food Insecurity: Not on file  Transportation Needs: Not on file  Physical Activity: Not on file  Stress: Not on file  Social Connections: Not on file  Intimate Partner Violence: Not on file    Outpatient Medications Prior to Visit  Medication Sig Dispense Refill   albuterol (VENTOLIN HFA) 108 (90 Base) MCG/ACT inhaler Inhale 2 puffs into the lungs every 4 (four) hours as needed for wheezing or shortness of breath. 8 g 2   atorvastatin (LIPITOR) 20 MG tablet Take 1 tablet by mouth once daily 30 tablet 0   Beclomethasone Dipropionate (QNASL) 80 MCG/ACT AERS      buPROPion (WELLBUTRIN XL) 300 MG 24 hr tablet Take 1 tablet (300 mg total) by mouth daily. 90 tablet 1   buPROPion (WELLBUTRIN XL) 300 MG 24 hr tablet Take 1 tablet by mouth every morning.     busPIRone (BUSPAR) 15 MG tablet Take 1 tablet by mouth twice daily 180 tablet 0   Carbinoxamine Maleate 4 MG TABS Take 1 tablet (4 mg total) by mouth every 6 (six) hours as needed. 120 tablet 5   montelukast (SINGULAIR) 10 MG tablet Take 1 tablet (10 mg total) by mouth at bedtime. 30  tablet 5   Olopatadine HCl (PATADAY) 0.2 % SOLN Apply to eye.     tiZANidine (ZANAFLEX) 2 MG tablet Take 2 mg by mouth every 8 (eight) hours as needed.     DULoxetine (CYMBALTA) 30 MG capsule Take 1 capsule (30 mg total) by mouth 2 (two) times daily. 60 capsule 3   No facility-administered medications prior to visit.    Allergies  Allergen Reactions   Morphine And Related Itching   Morphine And Related Itching   Tape Hives    ROS See HPI    Objective:    Physical Exam Constitutional:      Appearance: She is well-developed.  Cardiovascular:     Rate and Rhythm: Normal rate and regular rhythm.     Heart sounds: Normal heart sounds. No murmur heard. Pulmonary:     Effort: Pulmonary effort is normal. No respiratory distress.     Breath sounds: Normal breath sounds. No wheezing.  Psychiatric:        Behavior: Behavior normal.        Thought Content: Thought content normal.        Judgment: Judgment normal.     BP 132/82 (BP Location: Right Arm, Patient Position: Sitting, Cuff Size: Large)   Pulse 96   Temp 98.4 F (36.9 C) (Oral)   Resp 16   Wt 182 lb (82.6 kg)   LMP 11/22/2013   SpO2 95%   BMI 30.76 kg/m  Wt Readings from Last 3 Encounters:  03/01/22 182 lb (82.6 kg)  02/02/22 182 lb (82.6 kg)  01/25/22 183 lb 12.8 oz (83.4 kg)       Assessment & Plan:   Problem List Items Addressed This Visit       Unprioritized   Elevated blood-pressure reading, without diagnosis of hypertension - Primary    BP WNL today. Continue to monitor.       Depression    Improved on increased dose of cymbalta along with wellbutrin. Continue same. Follow up with PCP in 3 months.       Relevant Medications   DULoxetine (CYMBALTA) 30 MG capsule   Anxiety    Improved on increased dose of cymbalta.  Continue same.       Relevant Medications   DULoxetine (CYMBALTA) 30 MG capsule    I have changed Takiya Tetro's DULoxetine. I am also having her maintain her tiZANidine,  buPROPion, buPROPion, Qnasl, Olopatadine HCl, montelukast, Carbinoxamine Maleate, albuterol, busPIRone,  and atorvastatin.  Meds ordered this encounter  Medications   DULoxetine (CYMBALTA) 30 MG capsule    Sig: Take 3 capsules (90 mg total) by mouth 2 (two) times daily.    Dispense:  90 capsule    Refill:  3    Order Specific Question:   Supervising Provider    Answer:   Danise Edge A [[id number]]

## 2022-03-01 NOTE — Telephone Encounter (Signed)
[facility] pharmacy called to get clarification on pt's cymbalta. Melissa wrote the Rx for a total of 180mg  per day, however, rep stated the maximum recommended dose is 120mg . Rep wanted to confirm that the dosage was accurate before filling Rx.

## 2022-03-02 NOTE — Telephone Encounter (Signed)
Records release will be fax 

## 2022-03-08 ENCOUNTER — Encounter: Payer: Self-pay | Admitting: *Deleted

## 2022-03-14 ENCOUNTER — Other Ambulatory Visit: Payer: Self-pay | Admitting: Family Medicine

## 2022-03-23 ENCOUNTER — Ambulatory Visit: Payer: Managed Care, Other (non HMO) | Admitting: Psychiatry

## 2022-04-13 ENCOUNTER — Ambulatory Visit: Payer: Managed Care, Other (non HMO) | Admitting: Psychiatry

## 2022-04-14 ENCOUNTER — Other Ambulatory Visit: Payer: Self-pay | Admitting: Family Medicine

## 2022-04-18 ENCOUNTER — Telehealth: Payer: Self-pay | Admitting: Psychiatry

## 2022-04-18 ENCOUNTER — Ambulatory Visit: Payer: Managed Care, Other (non HMO) | Admitting: Psychiatry

## 2022-05-11 ENCOUNTER — Ambulatory Visit: Payer: Managed Care, Other (non HMO) | Admitting: Psychiatry

## 2022-05-23 ENCOUNTER — Other Ambulatory Visit: Payer: Self-pay | Admitting: Family Medicine

## 2022-05-31 NOTE — Progress Notes (Unsigned)
   Established Patient Office Visit  Subjective   Patient ID: Renee Chambers, female    DOB: 1965-09-23  Age: 57 y.o. MRN: [id number]  No chief complaint on file.   HPI   HYPERTENSION: - Medications: none, lifestyle measures only  - Compliance: *** - Checking BP at home: *** - Denies any SOB, recurrent headaches, CP, vision changes, LE edema, dizziness, palpitations, or medication side effects. - Diet: *** - Exercise: *** - Stressors:  HYPERLIPIDEMIA - medications: Lipitor 20 mg daily - compliance: *** - medication SEs: *** The 10-year ASCVD risk score (Arnett DK, et al., 2019) is: 2.3%   Values used to calculate the score:     Age: 66 years     Sex: Female     Is Non-Hispanic African American: No     Diabetic: No     Tobacco smoker: No     Systolic Blood Pressure: 132 mmHg     Is BP treated: No     HDL Cholesterol: 46.4 mg/dL     Total Cholesterol: 183 mg/dL     Anxiety/Depression: Wellbutrin 300 mg daily, Buspar 15 mg BID, and Cymbalta 90 mg doing well.    Prediabetes:  Lab Results  Component Value Date   HGBA1C 5.7 01/25/2022      {History (Optional):23778}  ROS    Objective:     LMP 11/22/2013  {Vitals History (Optional):23777}  Physical Exam   No results found for any visits on 06/01/22.  {Labs (Optional):23779}  The 10-year ASCVD risk score (Arnett DK, et al., 2019) is: 2.3%    Assessment & Plan:   Problem List Items Addressed This Visit   None   No follow-ups on file.    Clayborne Dana, NP

## 2022-06-01 ENCOUNTER — Ambulatory Visit (INDEPENDENT_AMBULATORY_CARE_PROVIDER_SITE_OTHER): Payer: BLUE CROSS/BLUE SHIELD | Admitting: Family Medicine

## 2022-06-01 ENCOUNTER — Encounter: Payer: Self-pay | Admitting: Family Medicine

## 2022-06-01 VITALS — BP 132/74 | HR 115 | Ht 64.5 in | Wt 182.4 lb

## 2022-06-01 DIAGNOSIS — M7711 Lateral epicondylitis, right elbow: Secondary | ICD-10-CM | POA: Diagnosis not present

## 2022-06-01 DIAGNOSIS — E782 Mixed hyperlipidemia: Secondary | ICD-10-CM | POA: Diagnosis not present

## 2022-06-01 DIAGNOSIS — R739 Hyperglycemia, unspecified: Secondary | ICD-10-CM

## 2022-06-01 DIAGNOSIS — F32A Depression, unspecified: Secondary | ICD-10-CM | POA: Diagnosis not present

## 2022-06-01 LAB — COMPREHENSIVE METABOLIC PANEL
ALT: 18 U/L (ref 0–35)
AST: 14 U/L (ref 0–37)
Albumin: 4.5 g/dL (ref 3.5–5.2)
Alkaline Phosphatase: 78 U/L (ref 39–117)
BUN: 14 mg/dL (ref 6–23)
CO2: 30 mEq/L (ref 19–32)
Calcium: 8.9 mg/dL (ref 8.4–10.5)
Chloride: 102 mEq/L (ref 96–112)
Creatinine, Ser: 0.75 mg/dL (ref 0.40–1.20)
GFR: 89.87 mL/min (ref >60.00)
Glucose, Bld: 140 mg/dL — ABNORMAL HIGH (ref 70–99)
Potassium: 4.2 mEq/L (ref 3.5–5.1)
Sodium: 140 mEq/L (ref 135–145)
Total Bilirubin: 0.6 mg/dL (ref 0.2–1.2)
Total Protein: 6.5 g/dL (ref 6.0–8.3)

## 2022-06-01 LAB — LIPID PANEL
Cholesterol: 176 mg/dL (ref 0–200)
HDL: 58.3 mg/dL (ref >39.00)
LDL Cholesterol: 85 mg/dL (ref 0–99)
NonHDL: 117.96
Total CHOL/HDL Ratio: 3
Triglycerides: 165 mg/dL — ABNORMAL HIGH (ref 0.0–149.0)
VLDL: 33 mg/dL (ref 0.0–40.0)

## 2022-06-01 LAB — CBC
HCT: 44.6 % (ref 36.0–46.0)
Hemoglobin: 15.4 g/dL — ABNORMAL HIGH (ref 12.0–15.0)
MCHC: 34.4 g/dL (ref 30.0–36.0)
MCV: 89.5 fl (ref 78.0–100.0)
Platelets: 298 10*3/uL (ref 150.0–400.0)
RBC: 4.99 Mil/uL (ref 3.87–5.11)
RDW: 13.3 % (ref 11.5–15.5)
WBC: 9.1 10*3/uL (ref 4.0–10.5)

## 2022-06-01 LAB — HEMOGLOBIN A1C: Hgb A1c MFr Bld: 5.8 % (ref 4.6–6.5)

## 2022-06-01 MED ORDER — ATORVASTATIN CALCIUM 20 MG PO TABS: 20.0000 mg | ORAL_TABLET | Freq: Every day | ORAL | 0 refills | Status: DC

## 2022-06-01 MED ORDER — BUSPIRONE HCL 15 MG PO TABS: 15.0000 mg | ORAL_TABLET | Freq: Two times a day (BID) | ORAL | 0 refills | Status: AC

## 2022-06-01 MED ORDER — BUPROPION HCL ER (XL) 300 MG PO TB24: 300.0000 mg | ORAL_TABLET | Freq: Every day | ORAL | 0 refills | Status: DC

## 2022-06-01 MED ORDER — TIZANIDINE HCL 2 MG PO TABS: 2.0000 mg | ORAL_TABLET | Freq: Three times a day (TID) | ORAL | 1 refills | Status: DC | PRN

## 2022-06-01 NOTE — Assessment & Plan Note (Signed)
-  Reviewed most recent lipid panel -Medication management: continue lipitor 20 mg daily  -Repeat CMP and lipid panel today -Diet low in saturated fat -Regular exercise - at least 30 minutes, 5 times per week

## 2022-06-01 NOTE — Patient Instructions (Addendum)
Tennis Elbow: - try regular NSAIDs for the next 2 weeks (ibuprofen or Aleve) - heat, ice, rest - home exercises   Updating your basic labs today.

## 2022-06-01 NOTE — Assessment & Plan Note (Signed)
Improved with new Cymbalta dose. No changes. Continue current meds. Refills provided. No SI/HI

## 2022-06-01 NOTE — Progress Notes (Signed)
Labs are stable! Triglycerides are still elevated but improving. Continue with lifestyle measures. A1c is stable, still in prediabetic range.   Lifestyle factors for lowering cholesterol include: Diet therapy - heart-healthy diet rich in fruits, veggies, fiber-rich whole grains, lean meats, chicken, fish (at least twice a week), fat-free or 1% dairy products; foods low in saturated/trans fats, cholesterol, sodium, and sugar. Mediterranean diet has shown to be very heart healthy. Regular exercise - recommend at least 30 minutes a day, 5 times per week Weight management

## 2022-07-18 ENCOUNTER — Ambulatory Visit: Payer: Commercial Managed Care - HMO | Admitting: Internal Medicine

## 2022-08-02 ENCOUNTER — Other Ambulatory Visit: Payer: Self-pay | Admitting: Orthopedic Surgery

## 2022-08-02 DIAGNOSIS — M4326 Fusion of spine, lumbar region: Secondary | ICD-10-CM

## 2022-08-03 ENCOUNTER — Other Ambulatory Visit: Payer: Self-pay | Admitting: Family Medicine

## 2022-08-06 ENCOUNTER — Other Ambulatory Visit: Payer: BLUE CROSS/BLUE SHIELD

## 2022-08-08 ENCOUNTER — Ambulatory Visit: Payer: Commercial Managed Care - HMO | Admitting: Internal Medicine

## 2022-08-10 ENCOUNTER — Other Ambulatory Visit: Payer: BLUE CROSS/BLUE SHIELD

## 2022-08-10 ENCOUNTER — Telehealth: Payer: Self-pay

## 2022-08-10 NOTE — Telephone Encounter (Signed)
Transition Care Management Unsuccessful Follow-up Telephone Call  Date of discharge and from where:  [facility] 08/09/2022  Attempts:  1st Attempt  Reason for unsuccessful TCM follow-up call:  No answer/busy Juanda Crumble, [place] Direct Dial [phone]

## 2022-08-11 NOTE — Telephone Encounter (Signed)
Transition Care Management Follow-up Telephone Call Date of discharge and from where: [facility] 08/09/2022 How have you been since you were released from the hospital? better Any questions or concerns? No  Items Reviewed: Did the pt receive and understand the discharge instructions provided? Yes  Medications obtained and verified? Yes  Other? No  Any new allergies since your discharge? No  Dietary orders reviewed? Yes Do you have support at home? Yes   Home Care and Equipment/Supplies: Were home health services ordered? yes If so, what is the name of the agency? [facility] Has the agency set up a time to come to the patient's home? yes Were any new equipment or medical supplies ordered?  No What is the name of the medical supply agency? N/a Were you able to get the supplies/equipment? no Do you have any questions related to the use of the equipment or supplies? No  Functional Questionnaire: (I = Independent and D = Dependent) ADLs: I  Bathing/Dressing- I  Meal Prep- I  Eating- I  Maintaining continence- I  Transferring/Ambulation- I  Managing Meds- I  Follow up appointments reviewed:  PCP Hospital f/u appt confirmed? Yes  Scheduled to see Caleen Jobs on 08/19/2022 @ 11:00. [facility] f/u appt confirmed? Yes  Scheduled to see Dr Sherlyn Lick on 08/18/2022 @ 11:00. Are transportation arrangements needed? No  If their condition worsens, is the pt aware to call PCP or go to the Emergency Dept.? Yes Was the patient provided with contact information for the PCP's office or ED? Yes Was to pt encouraged to call back with questions or concerns? Yes Juanda Crumble, LPN [place] Direct Dial [phone]

## 2022-08-14 NOTE — Progress Notes (Deleted)
FOLLOW UP Date of Service/Encounter:  08/14/22   Subjective:  Renee Chambers (DOB: 10/24/1965) is a 57 y.o. female who returns to the [facility] [place] on 08/15/2022 in re-evaluation of the following: asthma, allergic rhinitis, allergic conjunctivitis History obtained from: chart review and {Persons; PED relatives w/patient:19415::"patient"}.  For Review, LV was on 02/02/22  with Gareth Morgan, FNP seen for routine follow-up. Asthma controlled on singulair. ARC controlled on carbinoxamine daily. Continues to have hordeola, following [facility]. last environmental allergy skin prick testing was on 01/05/2016 and was positive to tree pollen, weed pollen, cockroach, and dust mite   Interim history-recently admitted and treated for osteomyelitis and epidural abscess on IV antibiotics  Today presents for follow-up. ***  Allergies as of 08/15/2022       Reactions   Morphine And Related Itching   Morphine And Related Itching   Tape Hives        Medication List        Accurate as of August 14, 2022  3:30 PM. If you have any questions, ask your nurse or doctor.          albuterol 108 (90 Base) MCG/ACT inhaler Commonly known as: VENTOLIN HFA Inhale 2 puffs into the lungs every 4 (four) hours as needed for wheezing or shortness of breath.   atorvastatin 20 MG tablet Commonly known as: LIPITOR Take 1 tablet (20 mg total) by mouth daily.   buPROPion 300 MG 24 hr tablet Commonly known as: WELLBUTRIN XL Take 1 tablet (300 mg total) by mouth daily.   busPIRone 15 MG tablet Commonly known as: BUSPAR Take 1 tablet (15 mg total) by mouth 2 (two) times daily.   Carbinoxamine Maleate 4 MG Tabs Take 1 tablet (4 mg total) by mouth every 6 (six) hours as needed.   DULoxetine 30 MG capsule Commonly known as: CYMBALTA Take 3 capsules (90 mg total) by mouth daily. Please cancel previous directions, this should be 3 capsules once daily #90 with 3 refills   montelukast 10  MG tablet Commonly known as: SINGULAIR TAKE 1 TABLET BY MOUTH AT BEDTIME   Pataday 0.2 % Soln Generic drug: Olopatadine HCl Apply to eye.   pregabalin 100 MG capsule Commonly known as: LYRICA Take 100 mg by mouth 2 (two) times daily.   Qnasl 80 MCG/ACT Aers Generic drug: Beclomethasone Dipropionate   tiZANidine 2 MG tablet Commonly known as: ZANAFLEX Take 1 tablet (2 mg total) by mouth every 8 (eight) hours as needed.       Past Medical History:  Diagnosis Date   Anxiety    Asthmatic bronchitis 03/04/2019   Depression    Hyperlipidemia    Medical history non-contributory    Past Surgical History:  Procedure Laterality Date   ABDOMINAL HYSTERECTOMY     APPENDECTOMY     LAPAROSCOPIC ASSISTED VAGINAL HYSTERECTOMY N/A 11/26/2013   Procedure: TOTAL ABDOMINAL HYSTERECTOMY/BILATERAL SALPINGO-OOPHORECTOMY/CYSTOSCOPY WITH STENT PLACEMENT/EXTENSIVE LYSIS OF ADHESIONS;  Surgeon: Logan Bores, MD;  Location: [place];  Service: Gynecology;  Laterality: N/A;  2 1/2hrs OR time   OVARIAN CYST REMOVAL     Otherwise, there have been no changes to her past medical history, surgical history, family history, or social history.  ROS: All others negative except as noted per HPI.   Objective:  LMP 11/22/2013  There is no height or weight on file to calculate BMI. Physical Exam: General Appearance:  Alert, cooperative, no distress, appears stated age  Head:  Normocephalic, without obvious abnormality, atraumatic  Eyes:  Conjunctiva clear, EOM's intact  Nose: Nares normal, {Blank multiple:19196:a:"***","hypertrophic turbinates","normal mucosa","no visible anterior polyps","septum midline"}  Throat: Lips, tongue normal; teeth and gums normal, {Blank multiple:19196:a:"***","normal posterior oropharynx","tonsils 2+","tonsils 3+","no tonsillar exudate","+ cobblestoning"}  Neck: Supple, symmetrical  Lungs:   {Blank multiple:19196:a:"***","clear to auscultation bilaterally","end-expiratory  wheezing","wheezing throughout"}, Respirations unlabored, {Blank multiple:19196:a:"***","no coughing","intermittent dry coughing"}  Heart:  {Blank multiple:19196:a:"***","regular rate and rhythm","no murmur"}, Appears well perfused  Extremities: No edema  Skin: Skin color, texture, turgor normal, no rashes or lesions on visualized portions of skin  Neurologic: No gross deficits   Reviewed: ***  Spirometry:  Tracings reviewed. Her effort: {Blank single:19197::"Good reproducible efforts.","It was hard to get consistent efforts and there is a question as to whether this reflects a maximal maneuver.","Poor effort, data can not be interpreted.","Variable effort-results affected.","decent for first attempt at spirometry."} FVC: ***L FEV1: ***L, ***% predicted FEV1/FVC ratio: ***% Interpretation: {Blank single:19197::"Spirometry consistent with mild obstructive disease","Spirometry consistent with moderate obstructive disease","Spirometry consistent with severe obstructive disease","Spirometry consistent with possible restrictive disease","Spirometry consistent with mixed obstructive and restrictive disease","Spirometry uninterpretable due to technique","Spirometry consistent with normal pattern","No overt abnormalities noted given today's efforts"}.  Please see scanned spirometry results for details.  Skin Testing: {Blank single:19197::"Select foods","Environmental allergy panel","Environmental allergy panel and select foods","Food allergy panel","None","Deferred due to recent antihistamines use","deferred due to recent reaction"}. ***Adequate positive and negative controls Results discussed with patient/family.   {Blank single:19197::"Allergy testing results were read and interpreted by myself, documented by clinical staff."," "}  Assessment/Plan   ***  Sigurd Sos, MD  [facility]

## 2022-08-15 ENCOUNTER — Ambulatory Visit: Payer: Commercial Managed Care - HMO | Admitting: Internal Medicine

## 2022-08-15 DIAGNOSIS — J309 Allergic rhinitis, unspecified: Secondary | ICD-10-CM

## 2022-08-17 ENCOUNTER — Other Ambulatory Visit: Payer: Self-pay | Admitting: Family

## 2022-08-17 DIAGNOSIS — F419 Anxiety disorder, unspecified: Secondary | ICD-10-CM

## 2022-08-17 DIAGNOSIS — F32A Depression, unspecified: Secondary | ICD-10-CM

## 2022-08-19 ENCOUNTER — Inpatient Hospital Stay: Payer: BLUE CROSS/BLUE SHIELD | Admitting: Family Medicine

## 2022-08-23 ENCOUNTER — Ambulatory Visit (INDEPENDENT_AMBULATORY_CARE_PROVIDER_SITE_OTHER): Payer: BLUE CROSS/BLUE SHIELD | Admitting: Family Medicine

## 2022-08-23 ENCOUNTER — Encounter: Payer: Self-pay | Admitting: Family Medicine

## 2022-08-23 VITALS — BP 122/80 | HR 118 | Temp 98.3°F | Resp 16 | Ht 64.0 in | Wt 185.0 lb

## 2022-08-23 DIAGNOSIS — M4626 Osteomyelitis of vertebra, lumbar region: Secondary | ICD-10-CM

## 2022-08-23 DIAGNOSIS — Z09 Encounter for follow-up examination after completed treatment for conditions other than malignant neoplasm: Secondary | ICD-10-CM

## 2022-08-23 NOTE — Progress Notes (Signed)
Acute Office Visit  Subjective:     Patient ID: Renee Chambers, female    DOB: [birth year], 57 y.o.   MRN: [id number]  CC: hospital follow-up   HPI Patient is in today for hospital follow-up.   On 06/29/2022 patient had L5-S1 laminectomy and fusion at [facility] as outpatient surgery. Instead of improving, she began having severe pain about 10 days later. She had follow-ups with spine specialist and they ordered CT/MRI, but patient states that because of insurance issues, she had trouble getting the images scheduled. On 08/04/2022, her pain was so severe that her husband took her back to the [facility] ED where she had an MRI and was admitted with acute osteomyelitis of lumbar spine. She had surgery on 08/05/2022 for irrigation/debridement and revision of L5-S1 fusion. She developed normocytic anemia with Hgb 11.5 dropping to 7.1, suspected related to surgical blood loss and anemia of chronic disease. She ended up having a blood transfusion and IV Venofer, then oral iron supplementation. Infectious Disease was consulted and she was started on IV cefazolin, with plans to continue through 09/30/2022 via PICC line at home. Home Health is coming weekly for dressing changes and lab draws. She had first follow-up with spine team last week and reports all is well; she is scheduled to see them again in a few weeks. She has her first ID follow-up in a few weeks with Dr. Vanessa [place] at [facility] for [facility] in [facility]. She reports pain is usually staying around 7/10, worse with movement, but she is getting relief with the Percocet. Patient denies any chest pain, palpitations, dyspnea, wheezing, edema, recurrent headaches, vision changes, blood loss, syncope.     ROS All review of systems negative except what is listed in the HPI      Objective:    BP 122/80   Pulse (!) 118   Temp 98.3 F (36.8 C)   Resp 16   Ht 5' 4"$  (1.626 m)   Wt 185 lb (83.9 kg)   LMP 11/22/2013    SpO2 96%   BMI 31.76 kg/m    Physical Exam Vitals reviewed.  Constitutional:      Appearance: Normal appearance.  Cardiovascular:     Rate and Rhythm: Normal rate and regular rhythm.     Pulses: Normal pulses.     Heart sounds: Normal heart sounds.  Pulmonary:     Effort: Pulmonary effort is normal.     Breath sounds: Normal breath sounds.  Musculoskeletal:     Comments: Back brace in place, limited ROM  Skin:    General: Skin is warm and dry.     Findings: No bruising, erythema, lesion or rash.     Comments: Right arm PICC line with dressing in place  Neurological:     Mental Status: She is alert and oriented to person, place, and time.  Psychiatric:        Mood and Affect: Mood normal.        Behavior: Behavior normal.        Thought Content: Thought content normal.        Judgment: Judgment normal.     No results found for any visits on 08/23/22.      Assessment & Plan:   1. Hospital discharge follow-up 2. Osteomyelitis of lumbar spine [facility]) Patient she is doing well overall with no acute concerns today. Reports that she is seeing all specialists as instructed and pain is managed with Percocet. She denies  any new symptoms. Advised her that we will review notes as they are sent to [place] and try to keep up with everything, but please do not hesitate to reach out with any concerns. Continue plan per specialists and keep routine follow-up scheduled with [place]. Patient aware of signs/symptoms requiring further/urgent evaluation.  Currently following with Dr. Algie Coffer (surgeon - [facility]) and Dr. Vanessa Zeeland (ID). Home health is coming weekly for dressing changes and lab draws, closely monitoring CBC. Last A1c 6.3% during hospitalization.   Return for - keep upcming scheduled appointment or follow-up sooner if needed .    I spent 35 minutes dedicated to the care of this patient on the date of this encounter to include pre-visit chart review of prior  notes and results, face-to-face time with the patient performing a medically appropriate exam, counseling/education regarding IV antibiotics and postop monitoring, and post-visit documentation.    Terrilyn Saver, NP

## 2022-08-30 ENCOUNTER — Ambulatory Visit: Payer: Self-pay | Admitting: Internal Medicine

## 2022-09-06 NOTE — Progress Notes (Deleted)
FOLLOW UP Date of Service/Encounter:  09/06/22   Subjective:  Renee Chambers (DOB: 12/03/1965) is a 57 y.o. female who returns to the [facility] on 09/08/2022 in re-evaluation of the following: asthma, allergic rhinitis, allergic conjunctivitis History obtained from: chart review and {Persons; PED relatives w/patient:19415::"patient"}.   For Review, LV was on 02/02/22  with Gareth Morgan, FNP seen for routine follow-up. Asthma controlled on singulair. ARC controlled on carbinoxamine daily. Continues to have hordeola, following with ophthalmology. last environmental allergy skin prick testing was on 01/05/2016 and was positive to tree pollen, weed pollen, cockroach, and dust mite    Interim history-recently admitted and treated for osteomyelitis and epidural abscess on IV antibiotics   Today presents for follow-up. ***  Allergies as of 09/08/2022       Reactions   Morphine And Related Itching   Morphine And Related Itching   Tape Hives        Medication List        Accurate as of September 06, 2022 12:48 PM. If you have any questions, ask your nurse or doctor.          albuterol 108 (90 Base) MCG/ACT inhaler Commonly known as: VENTOLIN HFA Inhale 2 puffs into the lungs every 4 (four) hours as needed for wheezing or shortness of breath.   atorvastatin 20 MG tablet Commonly known as: LIPITOR Take 1 tablet (20 mg total) by mouth daily.   buPROPion 300 MG 24 hr tablet Commonly known as: WELLBUTRIN XL Take 1 tablet (300 mg total) by mouth daily.   busPIRone 15 MG tablet Commonly known as: BUSPAR Take 1 tablet (15 mg total) by mouth 2 (two) times daily.   Carbinoxamine Maleate 4 MG Tabs Take 1 tablet (4 mg total) by mouth every 6 (six) hours as needed.   ceFAZolin 2-4 GM/100ML-% IVPB Commonly known as: ANCEF Inject into the vein.   cyclobenzaprine 10 MG tablet Commonly known as: FLEXERIL Take 10 mg by mouth 3 (three) times daily.   DULoxetine 30 MG  capsule Commonly known as: CYMBALTA TAKE 3 CAPSULES BY MOUTH ONCE DAILY   FeroSul 325 (65 FE) MG tablet Generic drug: ferrous sulfate Take 325 mg by mouth every morning.   montelukast 10 MG tablet Commonly known as: SINGULAIR TAKE 1 TABLET BY MOUTH AT BEDTIME   oxyCODONE-acetaminophen 7.5-325 MG tablet Commonly known as: PERCOCET Take 1 tablet by mouth every 4 (four) hours as needed.   Pataday 0.2 % Soln Generic drug: Olopatadine HCl Apply to eye.   pregabalin 150 MG capsule Commonly known as: LYRICA Take 150 mg by mouth.       Past Medical History:  Diagnosis Date   Anxiety    Asthmatic bronchitis 03/04/2019   Depression    Hyperlipidemia    Medical history non-contributory    Past Surgical History:  Procedure Laterality Date   ABDOMINAL HYSTERECTOMY     APPENDECTOMY     BACK SURGERY     LAPAROSCOPIC ASSISTED VAGINAL HYSTERECTOMY N/A 11/26/2013   Procedure: TOTAL ABDOMINAL HYSTERECTOMY/BILATERAL SALPINGO-OOPHORECTOMY/CYSTOSCOPY WITH STENT PLACEMENT/EXTENSIVE LYSIS OF ADHESIONS;  Surgeon: Logan Bores, MD;  Location: [facility];  Service: Gynecology;  Laterality: N/A;  2 1/2hrs OR time   OVARIAN CYST REMOVAL     Otherwise, there have been no changes to her past medical history, surgical history, family history, or social history.  ROS: All others negative except as noted per HPI.   Objective:  LMP 11/22/2013  There is no height or  weight on file to calculate BMI. Physical Exam: General Appearance:  Alert, cooperative, no distress, appears stated age  Head:  Normocephalic, without obvious abnormality, atraumatic  Eyes:  Conjunctiva clear, EOM's intact  Nose: Nares normal, {Blank multiple:19196:a:"***","hypertrophic turbinates","normal mucosa","no visible anterior polyps","septum midline"}  Throat: Lips, tongue normal; teeth and gums normal, {Blank multiple:19196:a:"***","normal posterior oropharynx","tonsils 2+","tonsils 3+","no tonsillar exudate","+  cobblestoning"}  Neck: Supple, symmetrical  Lungs:   {Blank multiple:19196:a:"***","clear to auscultation bilaterally","end-expiratory wheezing","wheezing throughout"}, Respirations unlabored, {Blank multiple:19196:a:"***","no coughing","intermittent dry coughing"}  Heart:  {Blank multiple:19196:a:"***","regular rate and rhythm","no murmur"}, Appears well perfused  Extremities: No edema  Skin: Skin color, texture, turgor normal, no rashes or lesions on visualized portions of skin  Neurologic: No gross deficits   Reviewed: ***  Spirometry:  Tracings reviewed. Her effort: {Blank single:19197::"Good reproducible efforts.","It was hard to get consistent efforts and there is a question as to whether this reflects a maximal maneuver.","Poor effort, data can not be interpreted.","Variable effort-results affected.","decent for first attempt at spirometry."} FVC: ***L FEV1: ***L, ***% predicted FEV1/FVC ratio: ***% Interpretation: {Blank single:19197::"Spirometry consistent with mild obstructive disease","Spirometry consistent with moderate obstructive disease","Spirometry consistent with severe obstructive disease","Spirometry consistent with possible restrictive disease","Spirometry consistent with mixed obstructive and restrictive disease","Spirometry uninterpretable due to technique","Spirometry consistent with normal pattern","No overt abnormalities noted given today's efforts"}.  Please see scanned spirometry results for details.  Skin Testing: {Blank single:19197::"Select foods","Environmental allergy panel","Environmental allergy panel and select foods","Food allergy panel","None","Deferred due to recent antihistamines use","deferred due to recent reaction"}. ***Adequate positive and negative controls Results discussed with patient/family.   {Blank single:19197::"Allergy testing results were read and interpreted by myself, documented by clinical staff."," "}  Assessment/Plan   ***  Sigurd Sos, MD  [facility]

## 2022-09-08 ENCOUNTER — Ambulatory Visit: Payer: Self-pay | Admitting: Internal Medicine

## 2022-09-12 ENCOUNTER — Other Ambulatory Visit: Payer: Self-pay | Admitting: Family Medicine

## 2022-09-12 DIAGNOSIS — F419 Anxiety disorder, unspecified: Secondary | ICD-10-CM

## 2022-09-12 DIAGNOSIS — F32A Depression, unspecified: Secondary | ICD-10-CM

## 2022-10-21 ENCOUNTER — Other Ambulatory Visit: Payer: Self-pay | Admitting: Family Medicine

## 2022-10-21 DIAGNOSIS — F32A Depression, unspecified: Secondary | ICD-10-CM

## 2022-10-23 NOTE — Progress Notes (Signed)
FOLLOW UP Date of Service/Encounter:  10/24/22   Subjective:  Renee Chambers (DOB: March 12, 1966) is a 57 y.o. female who [facility] in re-evaluation of the following: asthma, allergic rhinitis, allergic conjunctivitis, and phantosmia  History obtained from: chart review and patient.  For Review, LV was on 02/02/22  with Thermon Leyland, FNP seen for routine follow-up. I have never previously evaluated this patient.  See below for summary of history and diagnostics.  Therapeutic plans/changes recommended: She was doing well and no changes were made to her plan.   Pertinent History/Diagnostics:  Asthma: Controlled on singulair, alubterol PRN Allergic Rhinitis and conjunctivitis:  Controlleed on carbionxamine 4 mg q6h PRN. Systane and olopatadine as needed. Hx of intermittent hordeola, following with ophthalmology.  Has done AIT years ago (around 21 years ago), completed a little over a year and they were helpful. - SPT environmental panel 01/05/2016: positive to tree pollen, weed pollen, cockroach, and dust mite  ---------------------------------------------------- Today presents for follow-up. Since last visit, she had spinal surgeon fusion at [facility].  However, surgery complicated by spinal infection and subsequently required 10 weeks IV antibiotics which she has completed as of January 2024. Now recovering well. Her asthma has been doing well. She has not needed steroids or her rescue inhaler since last visit. She is still taking Singulair and carbinoxamine. Has not required antibiotics outside of most recently for spinal infection. Allergies are controlled but she is not outside much. Works from home. She was outside some this past weekend, and noticed her eyes did swell up some and her nose were really stuffy.  She is using pataday 1 drop BID.    Allergies as of 10/24/2022       Reactions   Morphine And Related Itching   Morphine And Related  Itching   Tape Hives        Medication List        Accurate as of October 24, 2022 11:08 AM. If you have any questions, ask your nurse or doctor.          albuterol 108 (90 Base) MCG/ACT inhaler Commonly known as: VENTOLIN HFA Inhale 2 puffs into the lungs every 4 (four) hours as needed for wheezing or shortness of breath.   atorvastatin 20 MG tablet Commonly known as: LIPITOR Take 1 tablet (20 mg total) by mouth daily.   buPROPion 300 MG 24 hr tablet Commonly known as: WELLBUTRIN XL Take 1 tablet by mouth once daily   busPIRone 15 MG tablet Commonly known as: BUSPAR Take 1 tablet (15 mg total) by mouth 2 (two) times daily.   Carbinoxamine Maleate 4 MG Tabs Take 1 tablet (4 mg total) by mouth every 6 (six) hours as needed.   ceFAZolin 2-4 GM/100ML-% IVPB Commonly known as: ANCEF Inject into the vein.   cyclobenzaprine 10 MG tablet Commonly known as: FLEXERIL Take 10 mg by mouth 3 (three) times daily.   DULoxetine 30 MG capsule Commonly known as: CYMBALTA TAKE 3 CAPSULES  BY MOUTH ONCE DAILY   FeroSul 325 (65 FE) MG tablet Generic drug: ferrous sulfate Take 325 mg by mouth every morning.   montelukast 10 MG tablet Commonly known as: SINGULAIR TAKE 1 TABLET BY MOUTH AT BEDTIME   oxyCODONE-acetaminophen 7.5-325 MG tablet Commonly known as: PERCOCET Take 1 tablet by mouth every 4 (four) hours as needed.   Pataday 0.2 % Soln Generic drug: Olopatadine HCl Apply to eye.   pregabalin 150 MG capsule Commonly  known as: LYRICA Take 150 mg by mouth.       Past Medical History:  Diagnosis Date   Anxiety    Asthmatic bronchitis 03/04/2019   Depression    Hyperlipidemia    Medical history non-contributory    Past Surgical History:  Procedure Laterality Date   ABDOMINAL HYSTERECTOMY     APPENDECTOMY     BACK SURGERY     LAPAROSCOPIC ASSISTED VAGINAL HYSTERECTOMY N/A 11/26/2013   Procedure: TOTAL ABDOMINAL HYSTERECTOMY/BILATERAL  SALPINGO-OOPHORECTOMY/CYSTOSCOPY WITH STENT PLACEMENT/EXTENSIVE LYSIS OF ADHESIONS;  Surgeon: Oliver Pila, MD;  Location: [facility];  Service: Gynecology;  Laterality: N/A;  2 1/2hrs OR time   OVARIAN CYST REMOVAL     Otherwise, there have been no changes to her past medical history, surgical history, family history, or social history.  ROS: All others negative except as noted per HPI.   Objective:  BP 126/82 (BP Location: Left Arm, Patient Position: Sitting, Cuff Size: Normal)   Pulse (!) 108   Temp 98.2 F (36.8 C) (Oral)   Resp 18   Ht  (1.626 m)   Wt 187 lb 4.8 oz (85 kg)   LMP 11/22/2013   SpO2 99%   BMI 32.15 kg/m  Body mass index is 32.15 kg/m. Physical Exam: General Appearance:  Alert, cooperative, no distress, appears stated age  Head:  Normocephalic, without obvious abnormality, atraumatic  Eyes:  Conjunctiva clear, EOM's intact  Nose: Nares normal, hypertrophic turbinates, normal mucosa, no visible anterior polyps, and septum midline  Throat: Lips, tongue normal; teeth and gums normal, + cobblestoning  Neck: Supple, symmetrical  Lungs:   clear to auscultation bilaterally, Respirations unlabored, no coughing  Heart:  regular rate and rhythm and no murmur, Appears well perfused  Extremities: No edema  Skin: Skin color, texture, turgor normal and no rashes or lesions on visualized portions of skin  Neurologic: No gross deficits   Spirometry:  Tracings reviewed. Her effort: Good reproducible efforts. FVC: 3.31L FEV1: 2.68L, 102% predicted FEV1/FVC ratio: 0.81 Interpretation: Spirometry consistent with normal pattern.  Please see scanned spirometry results for details.   Assessment/Plan   Asthma-controlled Breathing test looked great Continue montelukast 10 mg once a day to prevent cough or wheeze Continue albuterol 2 puffs once every 4 hours as needed for cough or wheeze You may use albuterol 2 puffs 5 to 15 minutes before activity to decrease cough or  wheeze  Allergic rhinitis-partially controlled Continue carbinoxamine to 4 mg- take one tablet every 6 hours as needed for post nasal drainage Consider Flonase 2 sprays in each nostril once a day as needed for a stuffy nose. In the right nostril, point the applicator out toward the right ear. In the left nostril, point the applicator out toward the left ear Consider saline nasal rinses as needed for nasal symptoms. Use this before any medicated nasal sprays for best result Consider allergy injections as a way to teach your body tolerance against the allergens which are bothering you.  Allergic conjunctivitis-partially controlled Continue Pataday one drop in each eye once a day as needed for red, itchy eyes Continue a lubricating eye drop as needed  Call the clinic if this treatment plan is not working well for you  Follow up in 12 months or sooner if needed.  Tonny Bollman, MD  [facility]

## 2022-10-24 ENCOUNTER — Ambulatory Visit: Payer: BLUE CROSS/BLUE SHIELD | Admitting: Internal Medicine

## 2022-10-24 ENCOUNTER — Encounter: Payer: Self-pay | Admitting: Internal Medicine

## 2022-10-24 ENCOUNTER — Other Ambulatory Visit: Payer: Self-pay

## 2022-10-24 VITALS — BP 126/82 | HR 108 | Temp 98.2°F | Resp 18 | Ht 64.0 in | Wt 187.3 lb

## 2022-10-24 DIAGNOSIS — J4520 Mild intermittent asthma, uncomplicated: Secondary | ICD-10-CM | POA: Diagnosis not present

## 2022-10-24 DIAGNOSIS — J302 Other seasonal allergic rhinitis: Secondary | ICD-10-CM

## 2022-10-24 DIAGNOSIS — J3089 Other allergic rhinitis: Secondary | ICD-10-CM

## 2022-10-24 DIAGNOSIS — H1013 Acute atopic conjunctivitis, bilateral: Secondary | ICD-10-CM

## 2022-10-24 MED ORDER — ALBUTEROL SULFATE HFA 108 (90 BASE) MCG/ACT IN AERS: 2.0000 | INHALATION_SPRAY | RESPIRATORY_TRACT | 2 refills | Status: AC | PRN

## 2022-10-24 MED ORDER — MONTELUKAST SODIUM 10 MG PO TABS: 10.0000 mg | ORAL_TABLET | Freq: Every day | ORAL | 3 refills | Status: AC

## 2022-10-24 MED ORDER — CARBINOXAMINE MALEATE 4 MG PO TABS: 4.0000 mg | ORAL_TABLET | Freq: Four times a day (QID) | ORAL | 12 refills | Status: AC | PRN

## 2022-10-24 NOTE — Patient Instructions (Addendum)
Asthma Breathing test looked great Continue montelukast 10 mg once a day to prevent cough or wheeze Continue albuterol 2 puffs once every 4 hours as needed for cough or wheeze You may use albuterol 2 puffs 5 to 15 minutes before activity to decrease cough or wheeze  Allergic rhinitis Continue carbinoxamine to 4 mg- take one tablet every 6 hours as needed for post nasal drainage Consider Flonase 2 sprays in each nostril once a day as needed for a stuffy nose. In the right nostril, point the applicator out toward the right ear. In the left nostril, point the applicator out toward the left ear Consider saline nasal rinses as needed for nasal symptoms. Use this before any medicated nasal sprays for best result Consider allergy injections as a way to teach your body tolerance against the allergens which are bothering you.  Allergic conjunctivitis Continue Pataday one drop in each eye once a day as needed for red, itchy eyes Continue a lubricating eye drop as needed  Call the clinic if this treatment plan is not working well for you  Follow up in 12 months or sooner if needed.  Reducing Pollen Exposure The American Academy of Allergy, Asthma and Immunology suggests the following steps to reduce your exposure to pollen during allergy seasons. Do not hang sheets or clothing out to dry; pollen may collect on these items. Do not mow lawns or spend time around freshly cut grass; mowing stirs up pollen. Keep windows closed at night.  Keep car windows closed while driving. Minimize morning activities outdoors, a time when pollen counts are usually at their highest. Stay indoors as much as possible when pollen counts or humidity is high and on windy days when pollen tends to remain in the air longer. Use air conditioning when possible.  Many air conditioners have filters that trap the pollen spores. Use a HEPA room air filter to remove pollen form the indoor air you breathe.  Control of Cockroach  Allergen Cockroach allergen has been identified as an important cause of acute attacks of asthma, especially in urban settings.  There are fifty-five species of cockroach that exist in the [place], however only three, the [place], [place] species produce allergen that can affect patients with Asthma.  Allergens can be obtained from fecal particles, egg casings and secretions from cockroaches.    Remove food sources. Reduce access to water. Seal access and entry points. Spray runways with 0.5-1% Diazinon or Chlorpyrifos Blow boric acid power under stoves and refrigerator. Place bait stations (hydramethylnon) at feeding sites.    Control of Dust Mite Allergen Dust mites play a major role in allergic asthma and rhinitis. They occur in environments with high humidity wherever human skin is found. Dust mites absorb humidity from the atmosphere (ie, they do not drink) and feed on organic matter (including shed human and animal skin). Dust mites are a microscopic type of insect that you cannot see with the naked eye. High levels of dust mites have been detected from mattresses, pillows, carpets, upholstered furniture, bed covers, clothes, soft toys and any woven material. The principal allergen of the dust mite is found in its feces. A gram of dust may contain 1,000 mites and 250,000 fecal particles. Mite antigen is easily measured in the air during house cleaning activities. Dust mites do not bite and do not cause harm to humans, other than by triggering allergies/asthma.  Ways to decrease your exposure to dust mites in your home:  1. Encase mattresses,  box springs and pillows with a mite-impermeable barrier or cover  2. Wash sheets, blankets and drapes weekly in hot water (130 F) with detergent and dry them in a dryer on the hot setting.  3. Have the room cleaned frequently with a vacuum cleaner and a damp dust-mop. For carpeting or rugs, vacuuming with a vacuum cleaner equipped  with a high-efficiency particulate air (HEPA) filter. The dust mite allergic individual should not be in a room which is being cleaned and should wait 1 hour after cleaning before going into the room.  4. Do not sleep on upholstered furniture (eg, couches).  5. If possible removing carpeting, upholstered furniture and drapery from the home is ideal. Horizontal blinds should be eliminated in the rooms where the person spends the most time (bedroom, study, television room). Washable vinyl, roller-type shades are optimal.  6. Remove all non-washable stuffed toys from the bedroom. Wash stuffed toys weekly like sheets and blankets above.  7. Reduce indoor humidity to less than 50%. Inexpensive humidity monitors can be purchased at most hardware stores. Do not use a humidifier as can make the problem worse and are not recommended.

## 2022-11-03 IMAGING — MG MM DIGITAL SCREENING BILAT W/ TOMO AND CAD
6 of 10 series · 6 of 30 positions shown · non-contrast
Comparison: Previous exam(s).

CLINICAL DATA: Screening.

EXAM:
DIGITAL SCREENING BILATERAL MAMMOGRAM WITH TOMOSYNTHESIS AND CAD
TECHNIQUE: Bilateral screening digital craniocaudal and mediolateral oblique
mammograms were obtained. Bilateral screening digital breast
tomosynthesis was performed. The images were evaluated with
computer-aided detection.

[L MLO synth-2D]
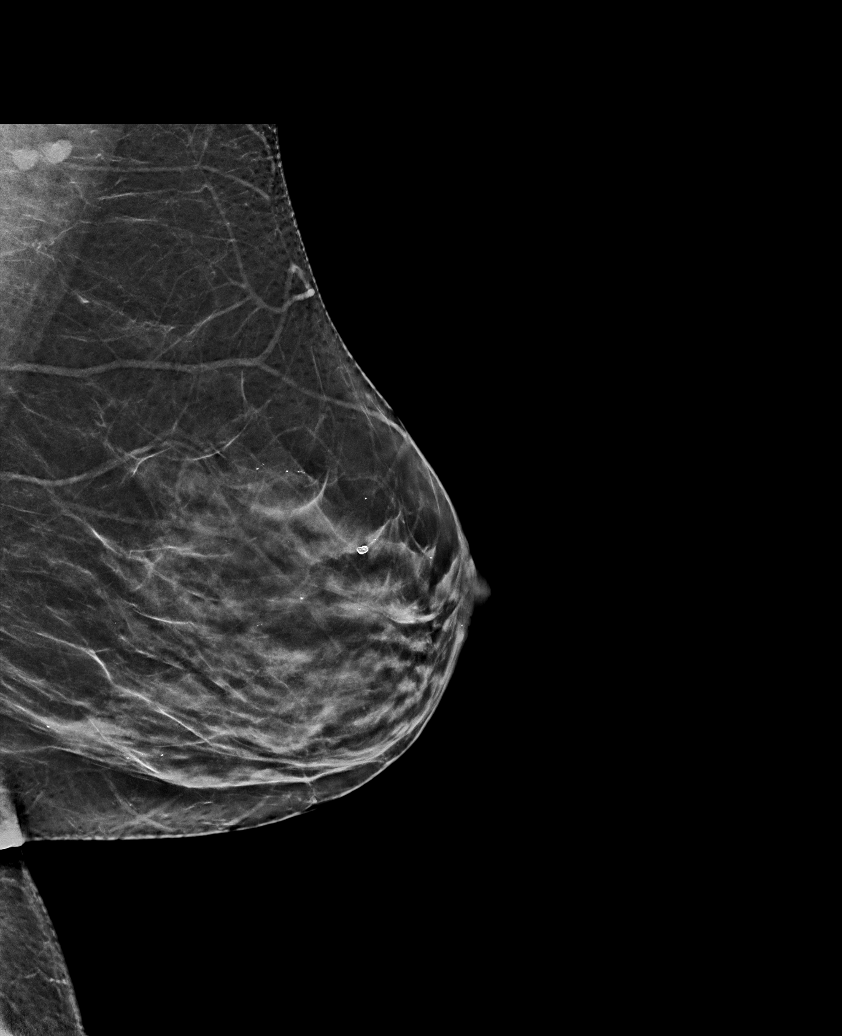

[R MLO synth-2D (1 of 2)]
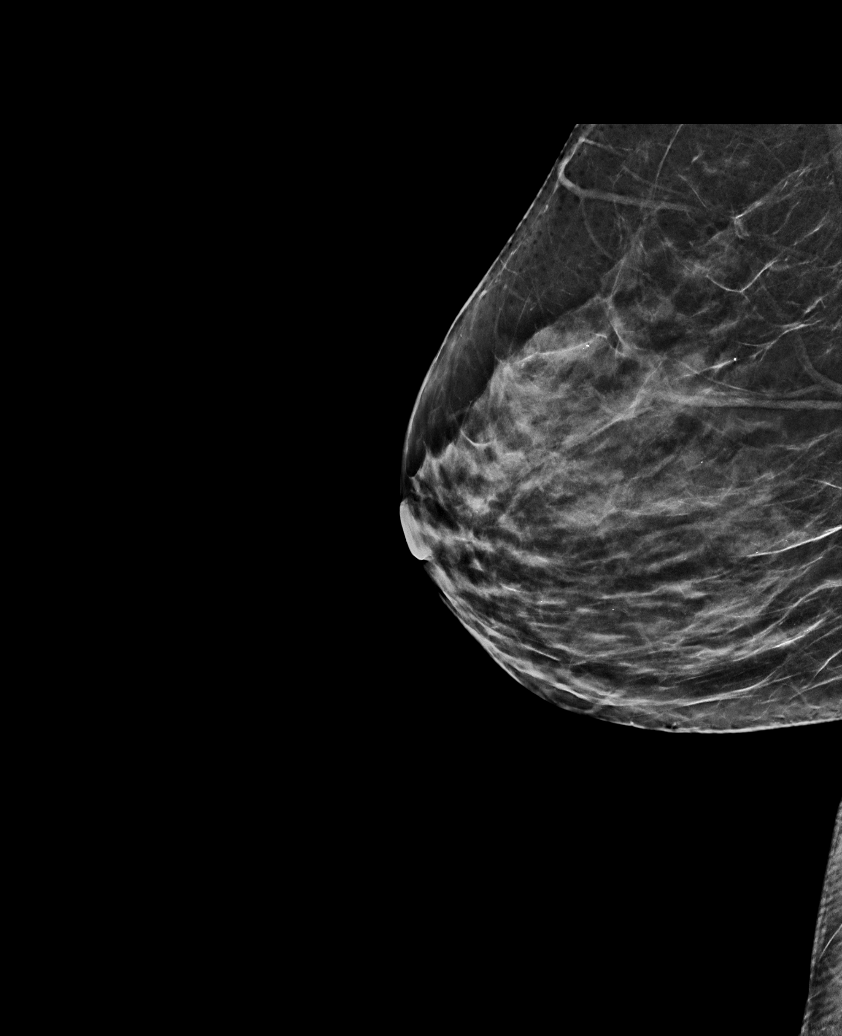

[R MLO synth-2D (2 of 2)]
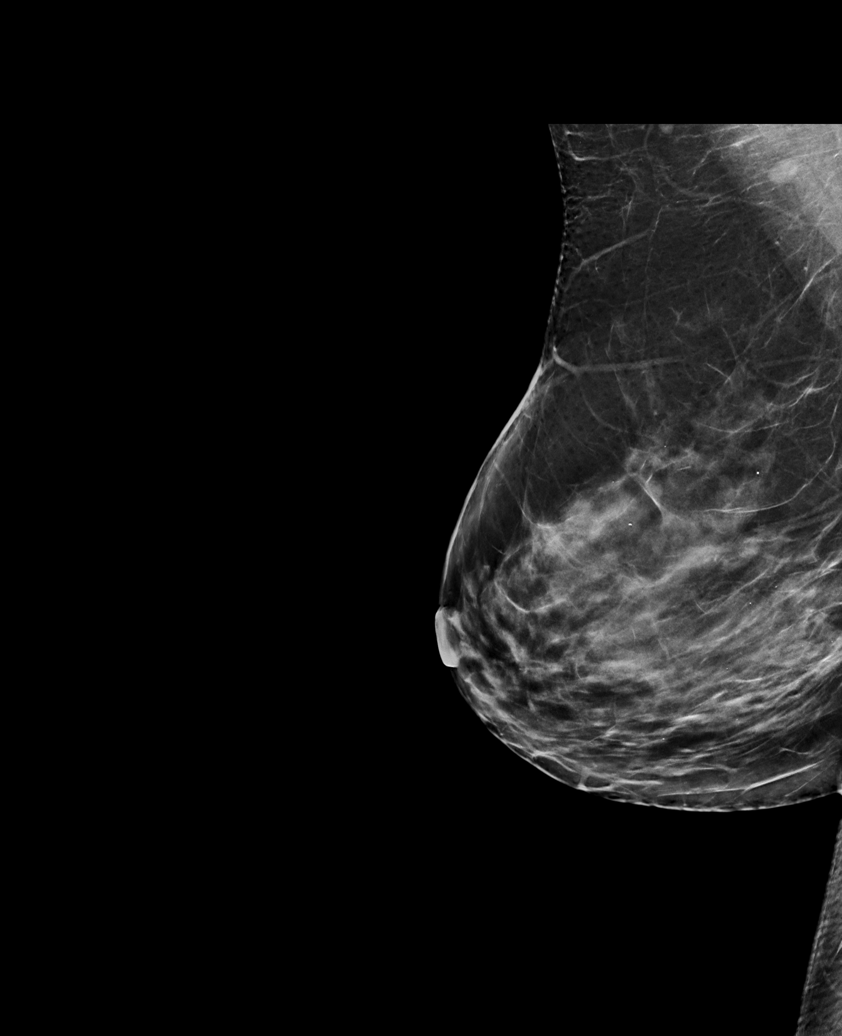

[L CC synth-2D]
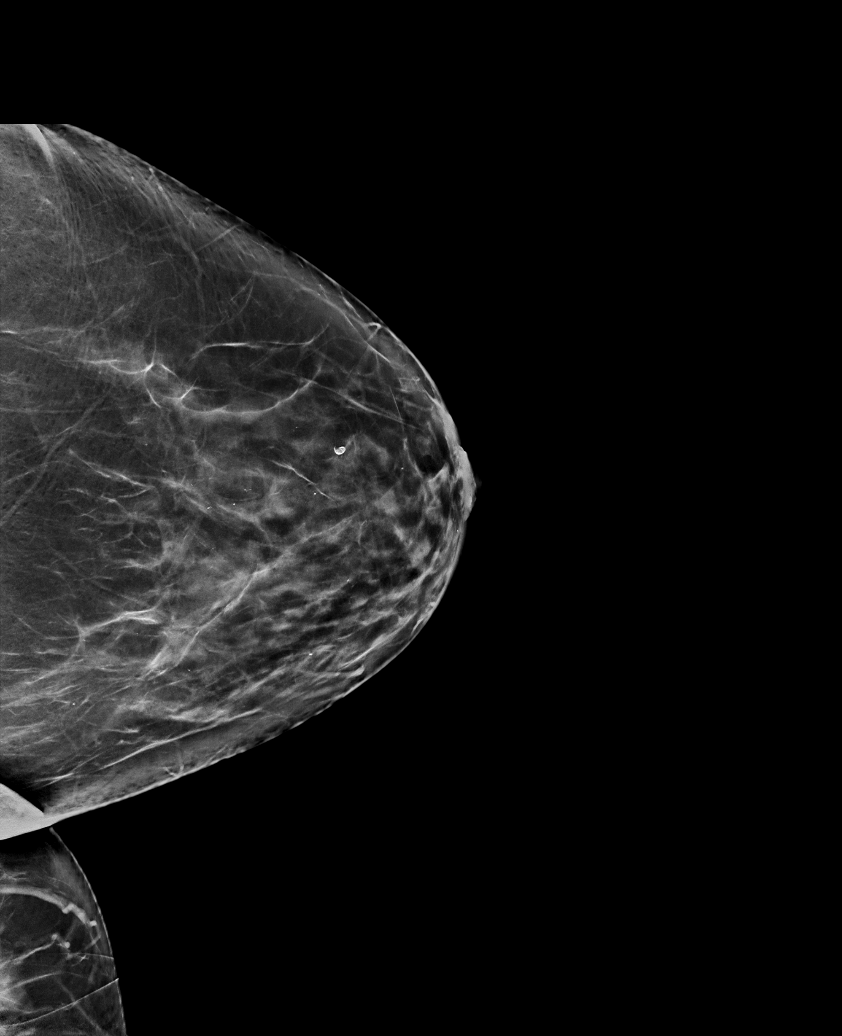

[R CC synth-2D]
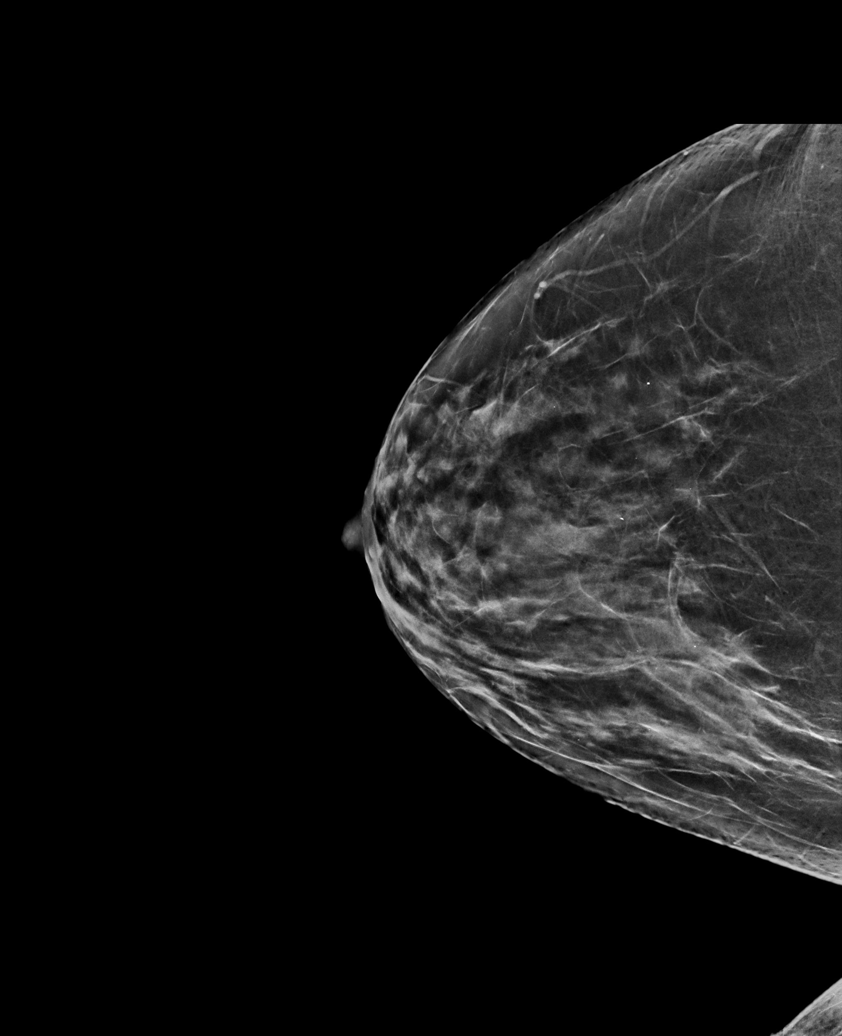

[L CC tomo · tomo slice 39/77.0]
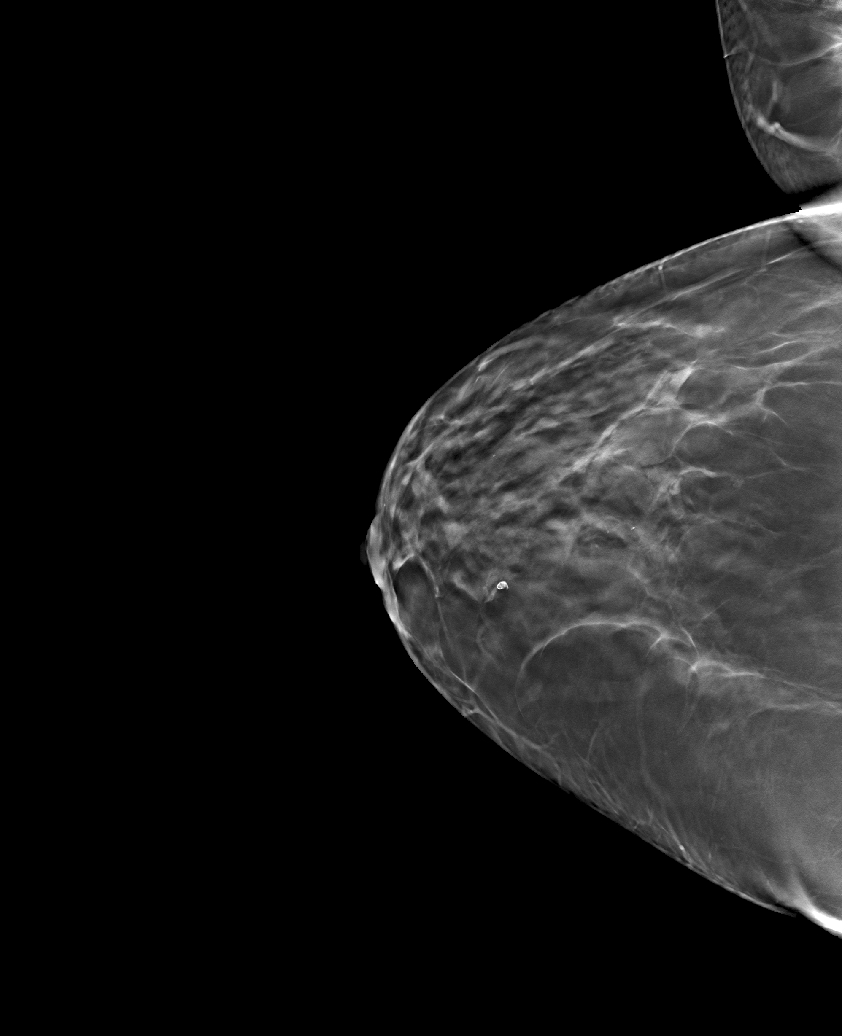

[6 of 30 positions shown; findings below may reference images not displayed]

ACR Breast Density Category c: The breast tissue is heterogeneously
dense, which may obscure small masses.
FINDINGS: There are no findings suspicious for malignancy.
IMPRESSION: No mammographic evidence of malignancy. A result letter of this
screening mammogram will be mailed directly to the patient.

RECOMMENDATION:
Screening mammogram in one year. (Code:Q3-W-BC3)

BI-RADS CATEGORY  1: Negative.

## 2022-11-07 ENCOUNTER — Ambulatory Visit: Payer: Managed Care, Other (non HMO) | Admitting: Psychiatry

## 2022-11-30 ENCOUNTER — Other Ambulatory Visit: Payer: Self-pay | Admitting: Family Medicine

## 2022-11-30 ENCOUNTER — Ambulatory Visit: Payer: BLUE CROSS/BLUE SHIELD | Admitting: Family Medicine

## 2022-11-30 DIAGNOSIS — E782 Mixed hyperlipidemia: Secondary | ICD-10-CM

## 2022-12-06 ENCOUNTER — Other Ambulatory Visit: Payer: Self-pay | Admitting: Family Medicine

## 2022-12-06 DIAGNOSIS — F32A Depression, unspecified: Secondary | ICD-10-CM

## 2022-12-06 DIAGNOSIS — F419 Anxiety disorder, unspecified: Secondary | ICD-10-CM

## 2023-01-20 ENCOUNTER — Ambulatory Visit: Payer: BLUE CROSS/BLUE SHIELD | Admitting: Family

## 2023-01-23 ENCOUNTER — Ambulatory Visit: Payer: BLUE CROSS/BLUE SHIELD | Admitting: Family

## 2023-01-23 ENCOUNTER — Other Ambulatory Visit: Payer: Self-pay | Admitting: Family Medicine

## 2023-01-23 DIAGNOSIS — J309 Allergic rhinitis, unspecified: Secondary | ICD-10-CM

## 2023-01-23 DIAGNOSIS — F32A Depression, unspecified: Secondary | ICD-10-CM

## 2023-02-27 ENCOUNTER — Ambulatory Visit: Payer: Self-pay | Admitting: Family

## 2023-02-27 DIAGNOSIS — J309 Allergic rhinitis, unspecified: Secondary | ICD-10-CM

## 2023-03-20 ENCOUNTER — Other Ambulatory Visit: Payer: Self-pay | Admitting: Family

## 2023-03-20 ENCOUNTER — Other Ambulatory Visit: Payer: Self-pay | Admitting: Family Medicine

## 2023-03-20 DIAGNOSIS — F32A Depression, unspecified: Secondary | ICD-10-CM

## 2023-03-20 DIAGNOSIS — E782 Mixed hyperlipidemia: Secondary | ICD-10-CM

## 2023-03-20 DIAGNOSIS — F419 Anxiety disorder, unspecified: Secondary | ICD-10-CM

## 2023-03-24 NOTE — Telephone Encounter (Signed)
Routed HP due to Error in Routing

## 2023-04-27 ENCOUNTER — Other Ambulatory Visit: Payer: Self-pay | Admitting: Family Medicine

## 2023-04-27 DIAGNOSIS — F32A Depression, unspecified: Secondary | ICD-10-CM

## 2023-05-25 ENCOUNTER — Other Ambulatory Visit: Payer: Self-pay | Admitting: Family Medicine

## 2023-05-25 DIAGNOSIS — F32A Depression, unspecified: Secondary | ICD-10-CM

## 2023-06-18 ENCOUNTER — Other Ambulatory Visit: Payer: Self-pay | Admitting: Family Medicine

## 2023-06-18 DIAGNOSIS — E782 Mixed hyperlipidemia: Secondary | ICD-10-CM
